# Patient Record
Sex: Male | Born: 1950 | Hispanic: Yes | Marital: Single | Smoking: Never smoker
Health system: Southern US, Community
[De-identification: ages and names within clinical notes are randomized; demographics above are authoritative.]

## PROBLEM LIST (undated history)

## (undated) DIAGNOSIS — E119 Type 2 diabetes mellitus without complications: Secondary | ICD-10-CM

## (undated) DIAGNOSIS — G4733 Obstructive sleep apnea (adult) (pediatric): Secondary | ICD-10-CM

## (undated) DIAGNOSIS — I10 Essential (primary) hypertension: Secondary | ICD-10-CM

## (undated) DIAGNOSIS — M5432 Sciatica, left side: Secondary | ICD-10-CM

## (undated) DIAGNOSIS — E785 Hyperlipidemia, unspecified: Secondary | ICD-10-CM

## (undated) DIAGNOSIS — E559 Vitamin D deficiency, unspecified: Secondary | ICD-10-CM

## (undated) HISTORY — DX: Type 2 diabetes mellitus without complications: E11.9

## (undated) HISTORY — DX: Obstructive sleep apnea (adult) (pediatric): G47.33

## (undated) HISTORY — DX: Vitamin D deficiency, unspecified: E55.9

## (undated) HISTORY — DX: Hyperlipidemia, unspecified: E78.5

## (undated) HISTORY — DX: Essential (primary) hypertension: I10

## (undated) HISTORY — DX: Pre-excitation syndrome: I45.6

## (undated) HISTORY — DX: Sciatica, left side: M54.32

## (undated) HISTORY — PX: APPENDECTOMY: SHX54

---

## 2020-09-11 DIAGNOSIS — E559 Vitamin D deficiency, unspecified: Secondary | ICD-10-CM | POA: Diagnosis not present

## 2020-09-11 DIAGNOSIS — E1169 Type 2 diabetes mellitus with other specified complication: Secondary | ICD-10-CM | POA: Diagnosis not present

## 2020-09-11 DIAGNOSIS — M5432 Sciatica, left side: Secondary | ICD-10-CM | POA: Diagnosis not present

## 2020-09-11 DIAGNOSIS — I10 Essential (primary) hypertension: Secondary | ICD-10-CM | POA: Diagnosis not present

## 2020-09-11 DIAGNOSIS — E785 Hyperlipidemia, unspecified: Secondary | ICD-10-CM | POA: Diagnosis not present

## 2020-09-11 DIAGNOSIS — I456 Pre-excitation syndrome: Secondary | ICD-10-CM | POA: Diagnosis not present

## 2020-11-11 DIAGNOSIS — E785 Hyperlipidemia, unspecified: Secondary | ICD-10-CM | POA: Diagnosis not present

## 2020-11-11 DIAGNOSIS — E559 Vitamin D deficiency, unspecified: Secondary | ICD-10-CM | POA: Diagnosis not present

## 2020-11-11 DIAGNOSIS — Z125 Encounter for screening for malignant neoplasm of prostate: Secondary | ICD-10-CM | POA: Diagnosis not present

## 2020-11-11 DIAGNOSIS — E1169 Type 2 diabetes mellitus with other specified complication: Secondary | ICD-10-CM | POA: Diagnosis not present

## 2020-11-11 DIAGNOSIS — I10 Essential (primary) hypertension: Secondary | ICD-10-CM | POA: Diagnosis not present

## 2020-11-11 DIAGNOSIS — Z7984 Long term (current) use of oral hypoglycemic drugs: Secondary | ICD-10-CM | POA: Diagnosis not present

## 2020-11-16 DIAGNOSIS — Z1389 Encounter for screening for other disorder: Secondary | ICD-10-CM | POA: Diagnosis not present

## 2020-11-16 DIAGNOSIS — Z0000 Encounter for general adult medical examination without abnormal findings: Secondary | ICD-10-CM | POA: Diagnosis not present

## 2020-11-16 DIAGNOSIS — I456 Pre-excitation syndrome: Secondary | ICD-10-CM | POA: Diagnosis not present

## 2020-11-16 DIAGNOSIS — E1169 Type 2 diabetes mellitus with other specified complication: Secondary | ICD-10-CM | POA: Diagnosis not present

## 2020-11-16 DIAGNOSIS — Z125 Encounter for screening for malignant neoplasm of prostate: Secondary | ICD-10-CM | POA: Diagnosis not present

## 2020-11-16 DIAGNOSIS — Z7984 Long term (current) use of oral hypoglycemic drugs: Secondary | ICD-10-CM | POA: Diagnosis not present

## 2020-11-16 DIAGNOSIS — E785 Hyperlipidemia, unspecified: Secondary | ICD-10-CM | POA: Diagnosis not present

## 2020-11-16 DIAGNOSIS — I10 Essential (primary) hypertension: Secondary | ICD-10-CM | POA: Diagnosis not present

## 2020-11-28 ENCOUNTER — Ambulatory Visit (HOSPITAL_COMMUNITY)
Admission: EM | Admit: 2020-11-28 | Discharge: 2020-11-28 | Disposition: A | Discharge status: Home / Self Care | Payer: Medicare HMO | Attending: Student | Admitting: Student

## 2020-11-28 ENCOUNTER — Encounter (HOSPITAL_COMMUNITY): Payer: Self-pay | Admitting: *Deleted

## 2020-11-28 ENCOUNTER — Other Ambulatory Visit: Payer: Self-pay

## 2020-11-28 DIAGNOSIS — U071 COVID-19: Secondary | ICD-10-CM | POA: Diagnosis not present

## 2020-11-28 DIAGNOSIS — J029 Acute pharyngitis, unspecified: Secondary | ICD-10-CM | POA: Diagnosis not present

## 2020-11-28 LAB — RESP PANEL BY RT-PCR (FLU A&B, COVID) ARPGX2
Influenza A by PCR: NEGATIVE
Influenza B by PCR: NEGATIVE
SARS Coronavirus 2 by RT PCR: POSITIVE — AB

## 2020-11-28 MED ORDER — LIDOCAINE VISCOUS HCL 2 % MT SOLN: 15.0000 mL | OROMUCOSAL | 0 refills | Status: DC | PRN

## 2020-11-28 NOTE — ED Triage Notes (Signed)
Pt reports cough and sore throat for 4 days.

## 2020-11-28 NOTE — ED Provider Notes (Signed)
[facility]    CSN: [id number] Arrival date & time: 11/28/20  1013      History   Chief Complaint Chief Complaint  Patient presents with  . Sore Throat    HPI Glenn Pope is a 70 y.o. male Presenting for URI symptoms for 2 days. Describes productive cough and sore throat x4 days. Describes sore throat as 6/10 pain, though denies pain currently. Endorsed chills 1 day ago but no longer. Denies fevers/chills, n/v/d, shortness of breath, chest pain,, congestion, facial pain, teeth pain, headaches,loss of taste/smell, swollen lymph nodes, ear pain. Denies chest pain, shortness of breath, confusion, high fevers. Fully vaccinated for covid-19.   HPI  History reviewed. No pertinent past medical history.  There are no problems to display for this patient.   History reviewed. No pertinent surgical history.     Home Medications    Prior to Admission medications   Medication Sig Start Date End Date Taking? Authorizing Provider  lidocaine (XYLOCAINE) 2 % solution Use as directed 15 mLs in the mouth or throat as needed for mouth pain. 11/28/20  Yes Rhys Martini, PA-C    Family History History reviewed. No pertinent family history.  Social History Social History   Tobacco Use  . Smoking status: Never Smoker  . Smokeless tobacco: Current User     Allergies   Patient has no known allergies.   Review of Systems Review of Systems  Constitutional: Negative for appetite change, chills and fever.  HENT: Positive for sore throat. Negative for congestion, ear pain, rhinorrhea, sinus pressure and sinus pain.   Eyes: Negative for redness and visual disturbance.  Respiratory: Positive for cough. Negative for chest tightness, shortness of breath and wheezing.   Cardiovascular: Negative for chest pain and palpitations.  Gastrointestinal: Negative for abdominal pain, constipation, diarrhea, nausea and vomiting.  Genitourinary: Negative for dysuria, frequency and urgency.   Musculoskeletal: Negative for myalgias.  Neurological: Negative for dizziness, weakness and headaches.  Psychiatric/Behavioral: Negative for confusion.  All other systems reviewed and are negative.    Physical Exam Triage Vital Signs ED Triage Vitals  Enc Vitals Group     BP 11/28/20 1057 113/73     Pulse Rate 11/28/20 1057 83     Resp 11/28/20 1057 16     Temp 11/28/20 1057 98.6 F (37 C)     Temp Source 11/28/20 1057 Oral     SpO2 11/28/20 1057 96 %     Weight --      Height --      Head Circumference --      Peak Flow --      Pain Score 11/28/20 1059 0     Pain Loc --      Pain Edu? --      Excl. in GC? --    No data found.  Updated Vital Signs BP 113/73 (BP Location: Right Arm)   Pulse 83   Temp 98.6 F (37 C) (Oral)   Resp 16   SpO2 96%   Visual Acuity Right Eye Distance:   Left Eye Distance:   Bilateral Distance:    Right Eye Near:   Left Eye Near:    Bilateral Near:     Physical Exam Vitals reviewed.  Constitutional:      General: He is not in acute distress.    Appearance: Normal appearance. He is not ill-appearing.  HENT:     Head: Normocephalic and atraumatic.     Right Ear: Hearing, tympanic  membrane, ear canal and external ear normal. No swelling or tenderness. There is no impacted cerumen. No mastoid tenderness. Tympanic membrane is not perforated, erythematous, retracted or bulging.     Left Ear: Hearing, tympanic membrane, ear canal and external ear normal. No swelling or tenderness. There is no impacted cerumen. No mastoid tenderness. Tympanic membrane is not perforated, erythematous, retracted or bulging.     Nose:     Right Sinus: No maxillary sinus tenderness or frontal sinus tenderness.     Left Sinus: No maxillary sinus tenderness or frontal sinus tenderness.     Mouth/Throat:     Mouth: Mucous membranes are moist.     Pharynx: Uvula midline. Posterior oropharyngeal erythema present. No oropharyngeal exudate.     Tonsils: No  tonsillar exudate.  Cardiovascular:     Rate and Rhythm: Normal rate and regular rhythm.     Heart sounds: Normal heart sounds.  Pulmonary:     Breath sounds: Normal breath sounds and air entry. No wheezing, rhonchi or rales.  Chest:     Chest wall: No tenderness.  Abdominal:     General: Abdomen is flat. Bowel sounds are normal.     Tenderness: There is no abdominal tenderness. There is no guarding or rebound.  Lymphadenopathy:     Cervical: No cervical adenopathy.  Neurological:     General: No focal deficit present.     Mental Status: He is alert and oriented to person, place, and time.  Psychiatric:        Attention and Perception: Attention and perception normal.        Mood and Affect: Mood and affect normal.        Behavior: Behavior normal. Behavior is cooperative.        Thought Content: Thought content normal.        Judgment: Judgment normal.      UC Treatments / Results  Labs (all labs ordered are listed, but only abnormal results are displayed) Labs Reviewed  RESP PANEL BY RT-PCR (FLU A&B, COVID) [id number]    EKG   Radiology No results found.  Procedures Procedures (including critical care time)  Medications Ordered in UC Medications - No data to display  Initial Impression / Assessment and Plan / UC Course  I have reviewed the triage vital signs and the nursing notes.  Pertinent labs & imaging results that were available during my care of the patient were reviewed by me and considered in my medical decision making (see chart for details).     Covid and influenza tests sent today. Patient is fully vaccinated for covid-19. Isolation precautions per CDC guidelines until negative result. Symptomatic relief with OTC Mucinex, Nyquil, etc. Lidocaine mouthwash for sore throat. Return precautions- new/worsening fevers/chills, shortness of breath, chest pain, abd pain, etc.   Final Clinical Impressions(s) / UC Diagnoses   Final diagnoses:  Viral pharyngitis      Discharge Instructions     Gargle lidocaine mouthwash every 4-6 hours as needed. Make sure not to swallow this. Don't eat for 1 hour after using.   .seek additional medical attention if you experience shortness of breath, chest pain, high fevers, etc.     ED Prescriptions    Medication Sig Dispense Auth. Provider   lidocaine (XYLOCAINE) 2 % solution Use as directed 15 mLs in the mouth or throat as needed for mouth pain. 100 mL Rhys Martini, PA-C     PDMP not reviewed this encounter.   Rhys Martini, PA-C  11/28/20 1153  

## 2020-11-28 NOTE — ED Triage Notes (Signed)
Pt reports he has had strep throat in thepast.

## 2020-11-28 NOTE — Discharge Instructions (Addendum)
Gargle lidocaine mouthwash every 4-6 hours as needed. Make sure not to swallow this. Don't eat for 1 hour after using.   .seek additional medical attention if you experience shortness of breath, chest pain, high fevers, etc.

## 2020-12-11 ENCOUNTER — Ambulatory Visit: Payer: Medicare HMO | Admitting: Cardiology

## 2020-12-11 ENCOUNTER — Other Ambulatory Visit: Payer: Self-pay

## 2020-12-11 ENCOUNTER — Encounter: Payer: Self-pay | Admitting: Cardiology

## 2020-12-11 VITALS — BP 90/60 | HR 58 | Ht 67.0 in | Wt 181.4 lb

## 2020-12-11 DIAGNOSIS — E7800 Pure hypercholesterolemia, unspecified: Secondary | ICD-10-CM | POA: Diagnosis not present

## 2020-12-11 DIAGNOSIS — I10 Essential (primary) hypertension: Secondary | ICD-10-CM

## 2020-12-11 DIAGNOSIS — I456 Pre-excitation syndrome: Secondary | ICD-10-CM | POA: Diagnosis not present

## 2020-12-11 MED ORDER — FLECAINIDE ACETATE 100 MG PO TABS: 100.0000 mg | ORAL_TABLET | Freq: Two times a day (BID) | ORAL | 3 refills | Status: DC

## 2020-12-11 NOTE — Patient Instructions (Addendum)
Medication Instructions:  No changes *If you need a refill on your cardiac medications before your next appointment, please call your pharmacy*   Lab Work: none If you have labs (blood work) drawn today and your tests are completely normal, you will receive your results only by: Marland Kitchen MyChart Message (if you have MyChart) OR . A paper copy in the mail If you have any lab test that is abnormal or we need to change your treatment, we will call you to review the results.   Testing/Procedures: none   Follow-Up: You have been referred to Cardiac Electrophysiology.  We will schedule you for a follow up visit with the Electrophysiologist for July 2022.    Other Instructions

## 2020-12-11 NOTE — Progress Notes (Signed)
Cardiology Office Note:    Date:  12/11/2020   ID:  Glenn Pope, DOB 05/23/1951, MRN [id number]  PCP:  Patient, No Pcp Per  [facility] Cardiologist:  No primary care provider on file.  [facility] Electrophysiologist:  None   Referring MD: Deatra James, MD    History of Present Illness:    Glenn Pope is a 70 y.o. male with a hx of HTN, HLD, DMII, OSA and WPW and recent COVID-19 infection on 11/29/20 who was referred by Dr. Wynelle Link for management of WPW.  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 ECG was obtained and he was told that he had WPW syndrome. He had recurrence of symptoms that usually resolved with vagal maneuvers. He subsequently moved to [facility] where he began to follow with a Cardiologist. 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  He had one episode of palpitations where vagal maneuvers failed to break the rhythm a couple of years ago requiring ICU stay DCCV. He therefore was started on flecainide and he has not had recurrence of symptoms. Last year he had a stress test which was reportedly normal.   Currently, the patient denies any chest pain, SOB, orthopnea, or LE edema. Has had one episode of orthostasis with LOC when bending over to wash his car but did not correlate with palpitations or arrhythmias. 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   Past Medical History:  Diagnosis Date  . DM (diabetes mellitus) (HCC)   . Hyperlipidemia   . Hypertension   . Left sciatic nerve pain   . OSA (obstructive sleep apnea)   . Vitamin D deficiency   . WPW (Wolff-Parkinson-White syndrome)     No past surgical history on file.  Current Medications: Current Meds  Medication Sig  . atorvastatin (LIPITOR) 40 MG tablet Take 40 mg by mouth daily.  .  cholecalciferol (VITAMIN D3) 25 MCG (1000 UNIT) tablet Take 1,000 Units by mouth daily.  Marland Kitchen lisinopril (ZESTRIL) 20 MG tablet Take 20 mg by mouth daily.  . metFORMIN (GLUCOPHAGE-XR) 500 MG 24 hr tablet Take 500 mg by mouth daily with breakfast.  . metoprolol tartrate (LOPRESSOR) 50 MG tablet Take 50 mg by mouth 2 (two) times daily.  . [DISCONTINUED] flecainide (TAMBOCOR) 100 MG tablet Take 100 mg by mouth 2 (two) times daily.     Allergies:   Patient has no known allergies.   Social History   Socioeconomic History  . Marital status: Single    Spouse name: Not on file  . Number of children: Not on file  . Years of education: Not on file  . Highest education level: Not on file  Occupational History  . Not on file  Tobacco Use  . Smoking status: Never Smoker  . Smokeless tobacco: Current User  Substance and Sexual Activity  . Alcohol use: Not on file  . Drug use: Not on file  . Sexual activity: Not on file  Other Topics Concern  . Not on file  Social History Narrative  . Not on file   Social Determinants of Health   Financial Resource Strain: Not on file  Food Insecurity: Not on file  Transportation Needs: Not on file  Physical Activity: Not on file  Stress: Not on  file  Social Connections: Not on file     Family History: The patient's family history includes Diabetes in his father; Hyperlipidemia in his mother.  ROS:   Please see the history of present illness.    Review of Systems  Constitutional: Negative for chills and fever.  HENT: Negative for nosebleeds.   Eyes: Negative for blurred vision.  Respiratory: Negative for sputum production.   Cardiovascular: Negative for chest pain, palpitations, orthopnea, claudication, leg swelling and PND.  Gastrointestinal: Negative for nausea and vomiting.  Genitourinary: Negative for hematuria.  Musculoskeletal: Negative for falls.  Neurological: Negative for headaches.  Endo/Heme/Allergies: Negative for polydipsia.   Psychiatric/Behavioral: Negative for substance abuse.    EKGs/Labs/Other Studies Reviewed:    The following studies were reviewed today:  No cardiac imaging. Will obtain records.  EKG:  EKG is  ordered today.  The ekg ordered today demonstrates sinus bradycardia, q-waves in V1-V3. Normal PR, normal QRS intervals. QTc 396. No distinct delta wave visualized.  Recent Labs: No results found for requested labs within last 8760 hours.  Recent Lipid Panel No results found for: CHOL, TRIG, HDL, CHOLHDL, VLDL, LDLCALC, LDLDIRECT    Physical Exam:    VS:  BP 90/60   Pulse (!) 58   Ht 5\' 7"  (1.702 m)   Wt 181 lb 6.4 oz (82.3 kg)   SpO2 94%   BMI 28.41 kg/m     Wt Readings from Last 3 Encounters:  12/11/20 181 lb 6.4 oz (82.3 kg)     GEN:  Well nourished, well developed in no acute distress HEENT: Normal NECK: No JVD; No carotid bruits CARDIAC: RRR, no murmurs, rubs, gallops RESPIRATORY:  Clear to auscultation without rales, wheezing or rhonchi  ABDOMEN: Soft, non-tender, non-distended MUSCULOSKELETAL:  No edema; No deformity  SKIN: Warm and dry NEUROLOGIC:  Alert and oriented x 3 PSYCHIATRIC:  Normal affect   ASSESSMENT:    1. WPW (Wolff-Parkinson-White syndrome)   2. Primary hypertension   3. Pure hypercholesterolemia    PLAN:    In order of problems listed above:  #WPW: Patient diagnosed with WPW several years ago while working in the 12/13/20. [place] to [place] where he was managed by Cardiology. 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 Has been maintained on flecainide with no recurrence of palpitations. -Continue flecainide 100mg  BID and metop (has been tolerating BB) -Obtain records from [facility] Cardiologist regarding recent TTE, attempted ablation and stress testing -Will have him follow with EP going forward; wants to avoid ablation for now as he has been well controlled on flec and  metop  #HTN: -Continue lisinopril 20mg  daily  #HLD: Managed by PCP. Well controlled with last LDL 60. -Continue lipitor 40mg  daily   Medication Adjustments/Labs and Tests Ordered: Current medicines are reviewed at length with the patient today.  Concerns regarding medicines are outlined above.  Orders Placed This Encounter  Procedures  . Ambulatory referral to Cardiac Electrophysiology  . EKG 12-Lead   Meds ordered this encounter  Medications  . flecainide (TAMBOCOR) 100 MG tablet    Sig: Take 1 tablet (100 mg total) by mouth 2 (two) times daily.    Dispense:  180 tablet    Refill:  3    Patient Instructions  Medication Instructions:  No changes *If you need a refill on your cardiac medications before your next appointment, please call your pharmacy*   Lab Work: none If you have  labs (blood work) drawn today and your tests are completely normal, you will receive your results only by: Marland Kitchen MyChart Message (if you have MyChart) OR . A paper copy in the mail If you have any lab test that is abnormal or we need to change your treatment, we will call you to review the results.   Testing/Procedures: none   Follow-Up: You have been referred to Cardiac Electrophysiology.  We will schedule you for a follow up visit with the Electrophysiologist for July 2022.    Other Instructions      Signed, Meriam Sprague, MD  12/11/2020 10:01 AM    [facility]

## 2021-01-04 DIAGNOSIS — Z20822 Contact with and (suspected) exposure to covid-19: Secondary | ICD-10-CM | POA: Diagnosis not present

## 2021-02-05 DIAGNOSIS — F331 Major depressive disorder, recurrent, moderate: Secondary | ICD-10-CM | POA: Diagnosis not present

## 2021-02-11 DIAGNOSIS — F4320 Adjustment disorder, unspecified: Secondary | ICD-10-CM | POA: Diagnosis not present

## 2021-02-24 DIAGNOSIS — F4320 Adjustment disorder, unspecified: Secondary | ICD-10-CM | POA: Diagnosis not present

## 2021-03-03 DIAGNOSIS — K0510 Chronic gingivitis, plaque induced: Secondary | ICD-10-CM | POA: Diagnosis not present

## 2021-03-04 DIAGNOSIS — F4320 Adjustment disorder, unspecified: Secondary | ICD-10-CM | POA: Diagnosis not present

## 2021-03-08 ENCOUNTER — Emergency Department (HOSPITAL_COMMUNITY)
Admission: EM | Admit: 2021-03-08 | Discharge: 2021-03-08 | Disposition: A | Discharge status: Home / Self Care | Payer: Medicare HMO | Attending: Emergency Medicine | Admitting: Emergency Medicine

## 2021-03-08 ENCOUNTER — Encounter (HOSPITAL_COMMUNITY): Payer: Self-pay

## 2021-03-08 ENCOUNTER — Emergency Department (HOSPITAL_COMMUNITY): Payer: Medicare HMO

## 2021-03-08 ENCOUNTER — Other Ambulatory Visit: Payer: Self-pay

## 2021-03-08 DIAGNOSIS — Z79899 Other long term (current) drug therapy: Secondary | ICD-10-CM | POA: Diagnosis not present

## 2021-03-08 DIAGNOSIS — R079 Chest pain, unspecified: Secondary | ICD-10-CM | POA: Diagnosis not present

## 2021-03-08 DIAGNOSIS — R072 Precordial pain: Secondary | ICD-10-CM | POA: Diagnosis not present

## 2021-03-08 DIAGNOSIS — R0789 Other chest pain: Secondary | ICD-10-CM | POA: Diagnosis not present

## 2021-03-08 DIAGNOSIS — I10 Essential (primary) hypertension: Secondary | ICD-10-CM | POA: Insufficient documentation

## 2021-03-08 DIAGNOSIS — E119 Type 2 diabetes mellitus without complications: Secondary | ICD-10-CM | POA: Diagnosis not present

## 2021-03-08 DIAGNOSIS — Z7984 Long term (current) use of oral hypoglycemic drugs: Secondary | ICD-10-CM | POA: Insufficient documentation

## 2021-03-08 LAB — COMPREHENSIVE METABOLIC PANEL
ALT: 31 U/L (ref 0–44)
AST: 22 U/L (ref 15–41)
Albumin: 3.9 g/dL (ref 3.5–5.0)
Alkaline Phosphatase: 74 U/L (ref 38–126)
Anion gap: 8 (ref 5–15)
BUN: 17 mg/dL (ref 8–23)
CO2: 27 mmol/L (ref 22–32)
Calcium: 9.7 mg/dL (ref 8.9–10.3)
Chloride: 102 mmol/L (ref 98–111)
Creatinine, Ser: 1.03 mg/dL (ref 0.61–1.24)
GFR, Estimated: >60 mL/min (ref >60)
Glucose, Bld: 115 mg/dL — ABNORMAL HIGH (ref 70–99)
Potassium: 4.5 mmol/L (ref 3.5–5.1)
Sodium: 137 mmol/L (ref 135–145)
Total Bilirubin: 1 mg/dL (ref 0.3–1.2)
Total Protein: 7 g/dL (ref 6.5–8.1)

## 2021-03-08 LAB — CBC
HCT: 49.4 % (ref 39.0–52.0)
Hemoglobin: 16.2 g/dL (ref 13.0–17.0)
MCH: 28.8 pg (ref 26.0–34.0)
MCHC: 32.8 g/dL (ref 30.0–36.0)
MCV: 87.7 fL (ref 80.0–100.0)
Platelets: 267 10*3/uL (ref 150–400)
RBC: 5.63 MIL/uL (ref 4.22–5.81)
RDW: 14.5 % (ref 11.5–15.5)
WBC: 6.6 10*3/uL (ref 4.0–10.5)
nRBC: 0 % (ref 0.0–0.2)

## 2021-03-08 LAB — TROPONIN I (HIGH SENSITIVITY)
Troponin I (High Sensitivity): 4 ng/L (ref <18)
Troponin I (High Sensitivity): 3 ng/L (ref <18)

## 2021-03-08 LAB — LIPASE, BLOOD: Lipase: 51 U/L (ref 11–51)

## 2021-03-08 NOTE — ED Notes (Signed)
Pt discharged and ambulated out of the ED without difficulty. 

## 2021-03-08 NOTE — ED Provider Notes (Signed)
I provided a substantive portion of the care of this patient.  I personally performed the entirety of the medical decision making for this encounter.  EKG Interpretation  Date/Time:  Monday March 08 2021 17:26:17 EDT Ventricular Rate:  77 PR Interval:  196 QRS Duration: 74 QT Interval:  370 QTC Calculation: 418 R Axis:   86 Text Interpretation: Normal sinus rhythm Anterior infarct , age undetermined Abnormal ECG Confirmed by Lorre Nick ([id number]) on 03/08/2021 10:15:32 PM  70 year old male presents after experiencing epigastric pain that got worse after he had fried foods.  Troponins are negative here.  No concern for ACS.  Likely GERD and will discharge   Lorre Nick, MD 03/08/21 2231

## 2021-03-08 NOTE — ED Provider Notes (Signed)
[facility] EMERGENCY DEPARTMENT Provider Note   CSN: [id number] Arrival date & time: 03/08/21  1720     History Chief Complaint  Patient presents with  . Chest Pain    Glenn Pope is a 70 y.o. male past medical history of diabetes, hyperlipidemia, hypertension, OSA, WPW, presenting for evaluation of chest pain. Pain began a few days ago, worsening today. CP is described as central, tightness/pressure. Concerned for symptomatic WPW. Never got ablated, taking flecainide. Pressure comes and goes, belching improves. No aggravating factors. No medications tried for symptoms. Pain does not radiate, it is not exertional or pleuritic.  Endorses increased stress level to town.  He has been seeing a therapist for the last couple of weeks, though does not think it is providing improvement.  He is not taking any medication for this, nor does he want to.    3-4 loose stools today. No abd pain, N/V, SOB, LH. No tobacco use.  The history is provided by the patient.       Past Medical History:  Diagnosis Date  . DM (diabetes mellitus) (HCC)   . Hyperlipidemia   . Hypertension   . Left sciatic nerve pain   . OSA (obstructive sleep apnea)   . Vitamin D deficiency   . WPW (Wolff-Parkinson-White syndrome)     There are no problems to display for this patient.   History reviewed. No pertinent surgical history.     Family History  Problem Relation Age of Onset  . Hyperlipidemia Mother   . Diabetes Father     Social History   Tobacco Use  . Smoking status: Never Smoker  . Smokeless tobacco: Never Used  Vaping Use  . Vaping Use: Never used  Substance Use Topics  . Alcohol use: Yes    Comment: occasionally  . Drug use: Never    Home Medications Prior to Admission medications   Medication Sig Start Date End Date Taking? Authorizing Provider  atorvastatin (LIPITOR) 40 MG tablet Take 40 mg by mouth daily.    [provider]  cholecalciferol  (VITAMIN D3) 25 MCG (1000 UNIT) tablet Take 1,000 Units by mouth daily.    [provider]  flecainide (TAMBOCOR) 100 MG tablet Take 1 tablet (100 mg total) by mouth 2 (two) times daily. 12/11/20   Meriam Sprague, MD  lisinopril (ZESTRIL) 20 MG tablet Take 20 mg by mouth daily.    [provider]  metFORMIN (GLUCOPHAGE-XR) 500 MG 24 hr tablet Take 500 mg by mouth daily with breakfast.    [provider]  metoprolol tartrate (LOPRESSOR) 50 MG tablet Take 50 mg by mouth 2 (two) times daily.    [provider]    Allergies    Patient has no known allergies.  Review of Systems   Review of Systems  All other systems reviewed and are negative.   Physical Exam Updated Vital Signs BP 135/74   Pulse 65   Temp 98.5 F (36.9 C) (Oral)   Resp (!) 21   Ht 5\' 7"  (1.702 m)   Wt 83.9 kg   SpO2 97%   BMI 28.98 kg/m   Physical Exam Vitals and nursing note reviewed.  Constitutional:      General: He is not in acute distress.    Appearance: He is well-developed.  HENT:     Head: Normocephalic and atraumatic.  Eyes:     Conjunctiva/sclera: Conjunctivae normal.  Cardiovascular:     Rate and Rhythm: Normal rate  and regular rhythm.     Pulses: Normal pulses.  Pulmonary:     Effort: Pulmonary effort is normal. No respiratory distress.     Breath sounds: Normal breath sounds.  Chest:     Chest wall: Tenderness (lower sternal) present.  Abdominal:     General: Bowel sounds are normal.     Palpations: Abdomen is soft.     Tenderness: There is no abdominal tenderness.  Musculoskeletal:     Right lower leg: No edema.     Left lower leg: No edema.  Skin:    General: Skin is warm.  Neurological:     Mental Status: He is alert.  Psychiatric:        Behavior: Behavior normal.     ED Results / Procedures / Treatments   Labs (all labs ordered are listed, but only abnormal results are displayed) Labs Reviewed  COMPREHENSIVE METABOLIC PANEL -  Abnormal; Notable for the following components:      Result Value   Glucose, Bld 115 (*)    All other components within normal limits  CBC  LIPASE, BLOOD  TROPONIN I (HIGH SENSITIVITY)  TROPONIN I (HIGH SENSITIVITY)    EKG EKG Interpretation  Date/Time:  Monday March 08 2021 17:26:17 EDT Ventricular Rate:  77 PR Interval:  196 QRS Duration: 74 QT Interval:  370 QTC Calculation: 418 R Axis:   86 Text Interpretation: Normal sinus rhythm Anterior infarct , age undetermined Abnormal ECG Confirmed by Lorre Nick ([id number]) on 03/08/2021 10:18:54 PM   Radiology DG Chest 2 View  Result Date: 03/08/2021 CLINICAL DATA:  Central chest pain for 4 days EXAM: CHEST - 2 VIEW COMPARISON:  None. FINDINGS: The heart size and mediastinal contours are within normal limits. Both lungs are clear. Disc degenerative disease of the thoracic spine. IMPRESSION: No acute abnormality of the lungs. Electronically Signed   By: Lauralyn Primes M.D.   On: 03/08/2021 18:20    Procedures Procedures   Medications Ordered in ED Medications - No data to display  ED Course  I have reviewed the triage vital signs and the nursing notes.  Pertinent labs & imaging results that were available during my care of the patient were reviewed by me and considered in my medical decision making (see chart for details).    MDM Rules/Calculators/A&P                           Pt presenting with substernal chest pain relieved with belching, and increased stress level. Low risk wells, VSS, no tracheal deviation, no JVD or new murmur, RRR, breath sounds equal bilaterally, EKG without acute abnormalities, negative troponin x2, and negative CXR. HEAR score 4. Patient is to be discharged with recommendation to follow up with his cardiologist in regards to today's hospital visit. Pt has been advised to return to the ED if CP becomes exertional, associated with diaphoresis or nausea, radiates to left jaw/arm, worsens or becomes concerning  in any way. Pt appears reliable for follow up and is agreeable to discharge.    Patient was discussed with and evaluated by Dr. Freida Busman.  Discussed results, findings, treatment and follow up. Patient advised of return precautions. Patient verbalized understanding and agreed with plan.   Final Clinical Impression(s) / ED Diagnoses Final diagnoses:  Central chest pain    Rx / DC Orders ED Discharge Orders    None       Maiko Salais, [place] N, PA-C 03/09/21 0005  Lorre Nick, MD 03/11/21 [phone]

## 2021-03-08 NOTE — ED Notes (Signed)
Pt c/o chest pressure that started earlier today. Pt reports pressure is gone at this time. Pt has a history of WPW.

## 2021-03-08 NOTE — ED Triage Notes (Signed)
Pt has been having intermittent chest pressure for past few days. Pt states he feels "gassy" with the pain. Pt has had shortness of breath, denies sweating, nausea or vomiting.

## 2021-03-08 NOTE — ED Triage Notes (Signed)
Emergency Medicine Provider Triage Evaluation Note  Glenn Pope , a 70 y.o. male  was evaluated in triage.  Pt complains of chest discomfort.  Located central chest.  Like he burps when he gets this pain.  Does not radiate to left arm, left back or jaw.  He denies any abdominal pain.  Does state he has had multiple loose stools today.  He has had 4 bowel movements today without any melena or bright blood per rectum.  No prior history of pancreatitis.  Chest pain is not exertional or pleuritic in nature.  No lateral leg swelling, redness or warmth.  Review of Systems  Positive: Chest discomfort Negative: Diaphoresis, nausea, vomiting, abdominal pain, melena, bright blood per rectum  Physical Exam  BP 135/87   Pulse 77   Temp 99.2 F (37.3 C)   Resp 17   Ht 5\' 7"  (1.702 m)   Wt 83.9 kg   SpO2 93%   BMI 28.98 kg/m  Gen:   Awake, no distress   HEENT:  Atraumatic  Resp:  Normal effort  Cardiac:  Normal rate  Abd:   Nondistended, nontender  MSK:   Moves extremities without difficulty  Neuro:  Speech clear   Medical Decision Making  Medically screening exam initiated at 5:35 PM.  Appropriate orders placed.  Haruki Arnold was informed that the remainder of the evaluation will be completed by another provider, this initial triage assessment does not replace that evaluation, and the importance of remaining in the ED until their evaluation is complete.  Clinical Impression  Chest discomfort   Petr Bontempo A, PA-C 03/08/21 1737

## 2021-03-08 NOTE — Discharge Instructions (Addendum)
Please read instructions below. Follow up with your primary care provider/cardiology this week. You can try treating your symptoms with antacids. Return to the ER for new or worsening symptoms; including worsening chest pain, shortness of breath, pain that radiates to the arm or neck, pain or shortness of breath worsened with exertion.

## 2021-03-09 DIAGNOSIS — K219 Gastro-esophageal reflux disease without esophagitis: Secondary | ICD-10-CM | POA: Diagnosis not present

## 2021-03-09 DIAGNOSIS — R079 Chest pain, unspecified: Secondary | ICD-10-CM | POA: Diagnosis not present

## 2021-03-11 DIAGNOSIS — F4320 Adjustment disorder, unspecified: Secondary | ICD-10-CM | POA: Diagnosis not present

## 2021-03-16 DIAGNOSIS — F4320 Adjustment disorder, unspecified: Secondary | ICD-10-CM | POA: Diagnosis not present

## 2021-03-24 DIAGNOSIS — F4320 Adjustment disorder, unspecified: Secondary | ICD-10-CM | POA: Diagnosis not present

## 2021-04-09 DIAGNOSIS — K219 Gastro-esophageal reflux disease without esophagitis: Secondary | ICD-10-CM | POA: Diagnosis not present

## 2021-04-09 DIAGNOSIS — I456 Pre-excitation syndrome: Secondary | ICD-10-CM | POA: Diagnosis not present

## 2021-04-27 DIAGNOSIS — Z833 Family history of diabetes mellitus: Secondary | ICD-10-CM | POA: Diagnosis not present

## 2021-04-27 DIAGNOSIS — I10 Essential (primary) hypertension: Secondary | ICD-10-CM | POA: Diagnosis not present

## 2021-04-27 DIAGNOSIS — E119 Type 2 diabetes mellitus without complications: Secondary | ICD-10-CM | POA: Diagnosis not present

## 2021-04-27 DIAGNOSIS — E785 Hyperlipidemia, unspecified: Secondary | ICD-10-CM | POA: Diagnosis not present

## 2021-04-27 DIAGNOSIS — Z7984 Long term (current) use of oral hypoglycemic drugs: Secondary | ICD-10-CM | POA: Diagnosis not present

## 2021-04-27 DIAGNOSIS — I25119 Atherosclerotic heart disease of native coronary artery with unspecified angina pectoris: Secondary | ICD-10-CM | POA: Diagnosis not present

## 2021-04-27 DIAGNOSIS — E663 Overweight: Secondary | ICD-10-CM | POA: Diagnosis not present

## 2021-04-27 DIAGNOSIS — I456 Pre-excitation syndrome: Secondary | ICD-10-CM | POA: Diagnosis not present

## 2021-04-27 DIAGNOSIS — Z6829 Body mass index (BMI) 29.0-29.9, adult: Secondary | ICD-10-CM | POA: Diagnosis not present

## 2021-05-18 DIAGNOSIS — E1169 Type 2 diabetes mellitus with other specified complication: Secondary | ICD-10-CM | POA: Diagnosis not present

## 2021-05-18 DIAGNOSIS — I10 Essential (primary) hypertension: Secondary | ICD-10-CM | POA: Diagnosis not present

## 2021-05-18 DIAGNOSIS — Z7984 Long term (current) use of oral hypoglycemic drugs: Secondary | ICD-10-CM | POA: Diagnosis not present

## 2021-05-18 DIAGNOSIS — E785 Hyperlipidemia, unspecified: Secondary | ICD-10-CM | POA: Diagnosis not present

## 2021-05-20 ENCOUNTER — Other Ambulatory Visit: Payer: Self-pay

## 2021-05-20 ENCOUNTER — Encounter: Payer: Self-pay | Admitting: Internal Medicine

## 2021-05-20 ENCOUNTER — Ambulatory Visit: Payer: Medicare HMO | Admitting: Internal Medicine

## 2021-05-20 DIAGNOSIS — I456 Pre-excitation syndrome: Secondary | ICD-10-CM | POA: Diagnosis not present

## 2021-05-20 NOTE — Patient Instructions (Addendum)

## 2021-05-20 NOTE — Progress Notes (Signed)
HPI [name] is referred today by Dr. Shari Prows for evaluation of WPW. He has a h/o palpitations and ventricular pre-excitation. He underwent attempted ablation 6 years ago in [facility] but was told that his pathway was said to be too close to his AV node and he was not ablated. Since then he has been on flecainide and his symptoms are mostly controlled. He denies syncope.  No Known Allergies   Current Outpatient Medications  Medication Sig Dispense Refill   atorvastatin (LIPITOR) 40 MG tablet Take 40 mg by mouth daily.     cholecalciferol (VITAMIN D3) 25 MCG (1000 UNIT) tablet Take 1,000 Units by mouth daily.     flecainide (TAMBOCOR) 100 MG tablet Take 1 tablet (100 mg total) by mouth 2 (two) times daily. 180 tablet 3   lisinopril (ZESTRIL) 20 MG tablet Take 20 mg by mouth daily.     metFORMIN (GLUCOPHAGE-XR) 500 MG 24 hr tablet Take 500 mg by mouth daily with breakfast.     metoprolol tartrate (LOPRESSOR) 50 MG tablet Take 50 mg by mouth 2 (two) times daily.     No current facility-administered medications for this visit.     Past Medical History:  Diagnosis Date   DM (diabetes mellitus) (HCC)    Hyperlipidemia    Hypertension    Left sciatic nerve pain    OSA (obstructive sleep apnea)    Vitamin D deficiency    WPW (Wolff-Parkinson-White syndrome)     ROS:   All systems reviewed and negative except as noted in the HPI.   History reviewed. No pertinent surgical history.   Family History  Problem Relation Age of Onset   Hyperlipidemia Mother    Diabetes Father      Social History   Socioeconomic History   Marital status: Single    Spouse name: Not on file   Number of children: Not on file   Years of education: Not on file   Highest education level: Not on file  Occupational History   Not on file  Tobacco Use   Smoking status: Never   Smokeless tobacco: Never  Vaping Use   Vaping Use: Never used  Substance and Sexual Activity   Alcohol use:  Yes    Comment: occasionally   Drug use: Never   Sexual activity: Not on file  Other Topics Concern   Not on file  Social History Narrative   Not on file   Social Determinants of Health   Financial Resource Strain: Not on file  Food Insecurity: Not on file  Transportation Needs: Not on file  Physical Activity: Not on file  Stress: Not on file  Social Connections: Not on file  Intimate Partner Violence: Not on file     BP 112/68   Pulse (!) 59   Ht 5\' 7"  (1.702 m)   Wt 184 lb 9.6 oz (83.7 kg)   SpO2 97%   BMI 28.91 kg/m   Physical Exam:  Well appearing overweight 70 yo man, NAD HEENT: Unremarkable Neck:  6 cm JVD, no thyromegally Lymphatics:  No adenopathy Back:  No CVA tenderness Lungs:  Clear with no wheezes HEART:  Regular rate rhythm, no murmurs, no rubs, no clicks Abd:  soft, obese, positive bowel sounds, no organomegally, no rebound, no guarding Ext:  2 plus pulses, no edema, no cyanosis, no clubbing Skin:  No rashes no nodules Neuro:  CN II through XII intact, motor grossly intact  EKG - sinus brady  with no pre-excitation   Assess/Plan:  WPW - he has no pre-excitation on his ECG. He appears to be well controlled on flecainide. I discussed catheter ablation and PPM and recommend that he continue the flecainide. HTN - his bp is well controlled. No change in meds. Dyslipidemia -he will continue his statin therapy  Dorathy Daft

## 2021-06-22 DIAGNOSIS — H524 Presbyopia: Secondary | ICD-10-CM | POA: Diagnosis not present

## 2021-08-07 IMAGING — CR DG CHEST 2V
2 series · 2 of 2 positions shown · non-contrast
Comparison: None.

CLINICAL DATA: Central chest pain for 4 days

EXAM:
CHEST - 2 VIEW

[chest pa]
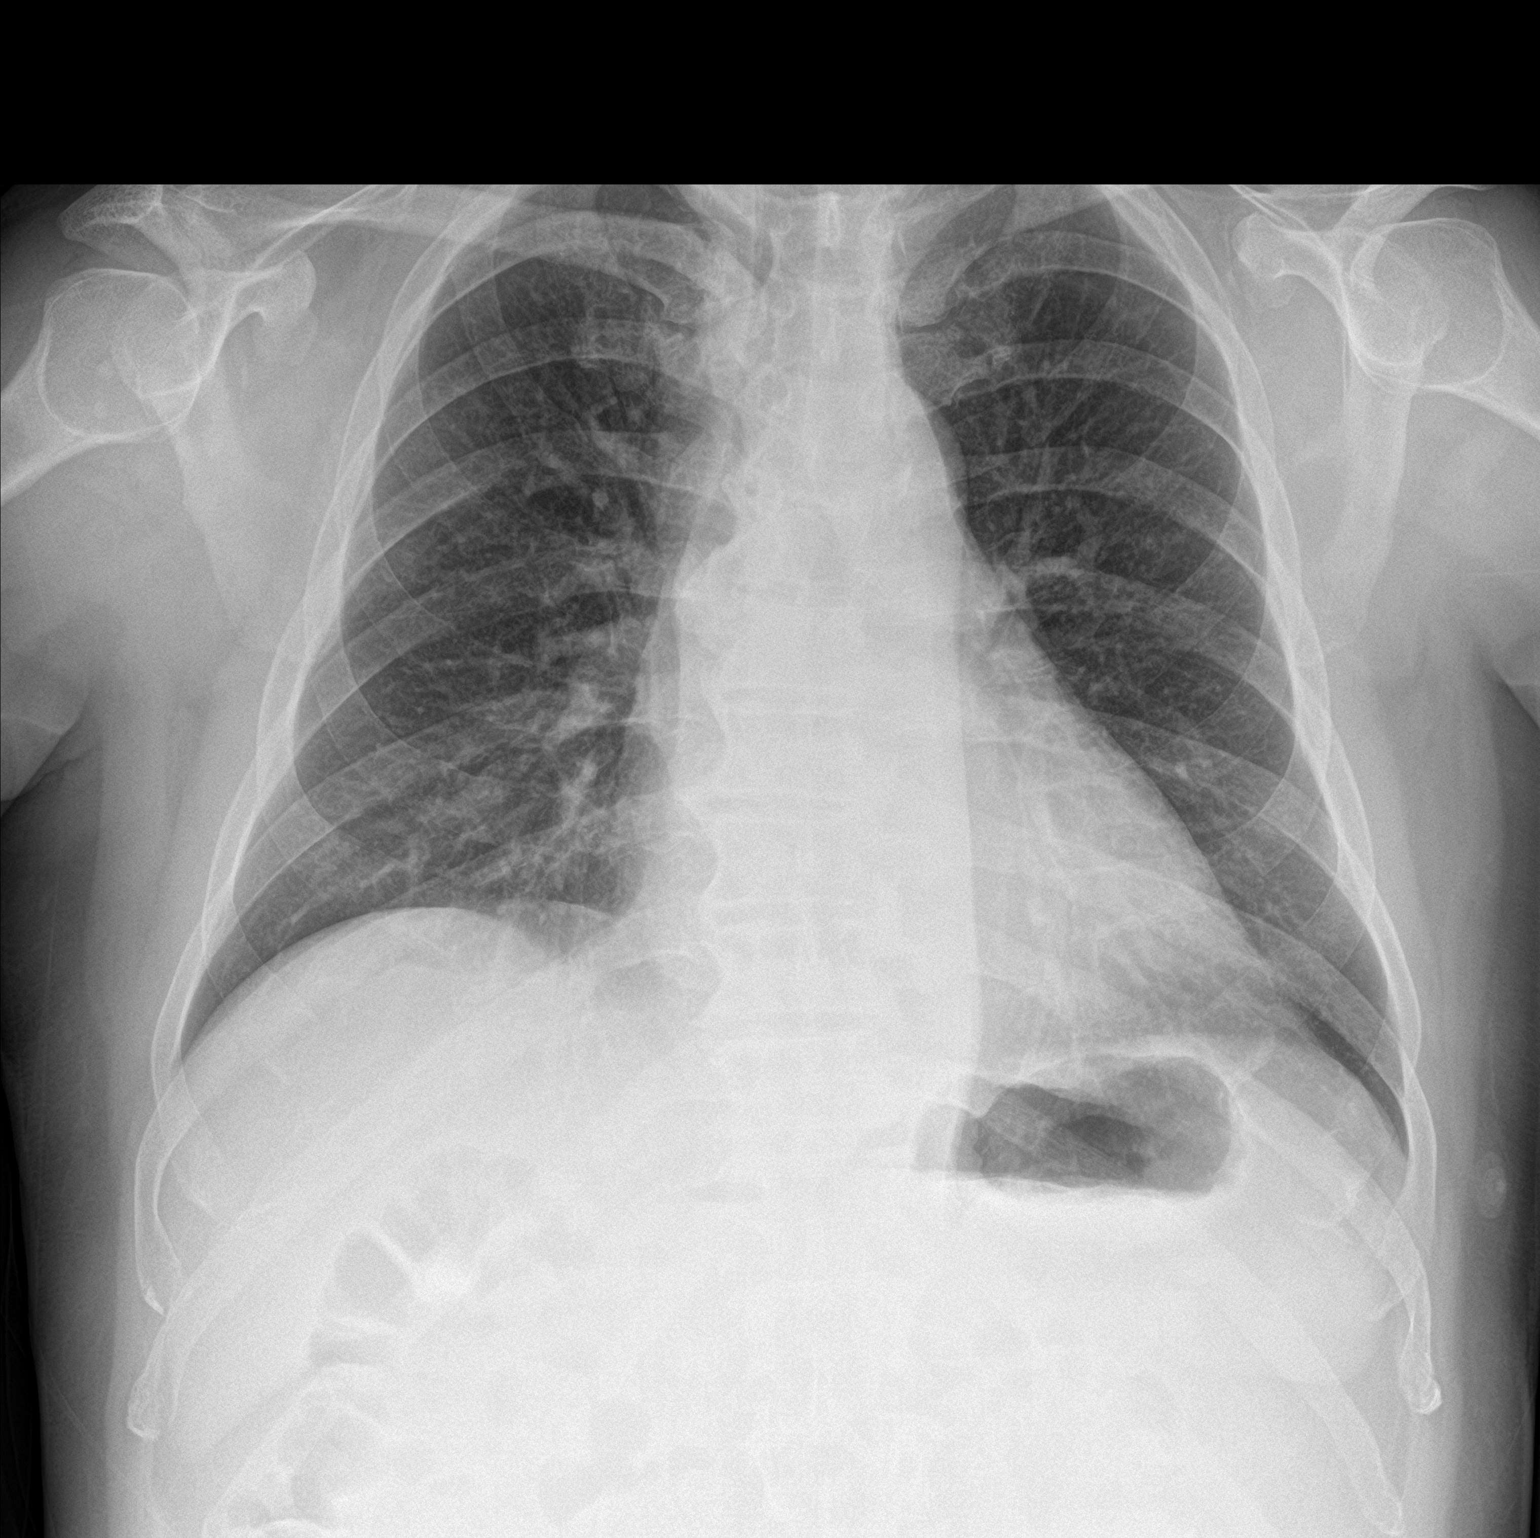

[chest lat]
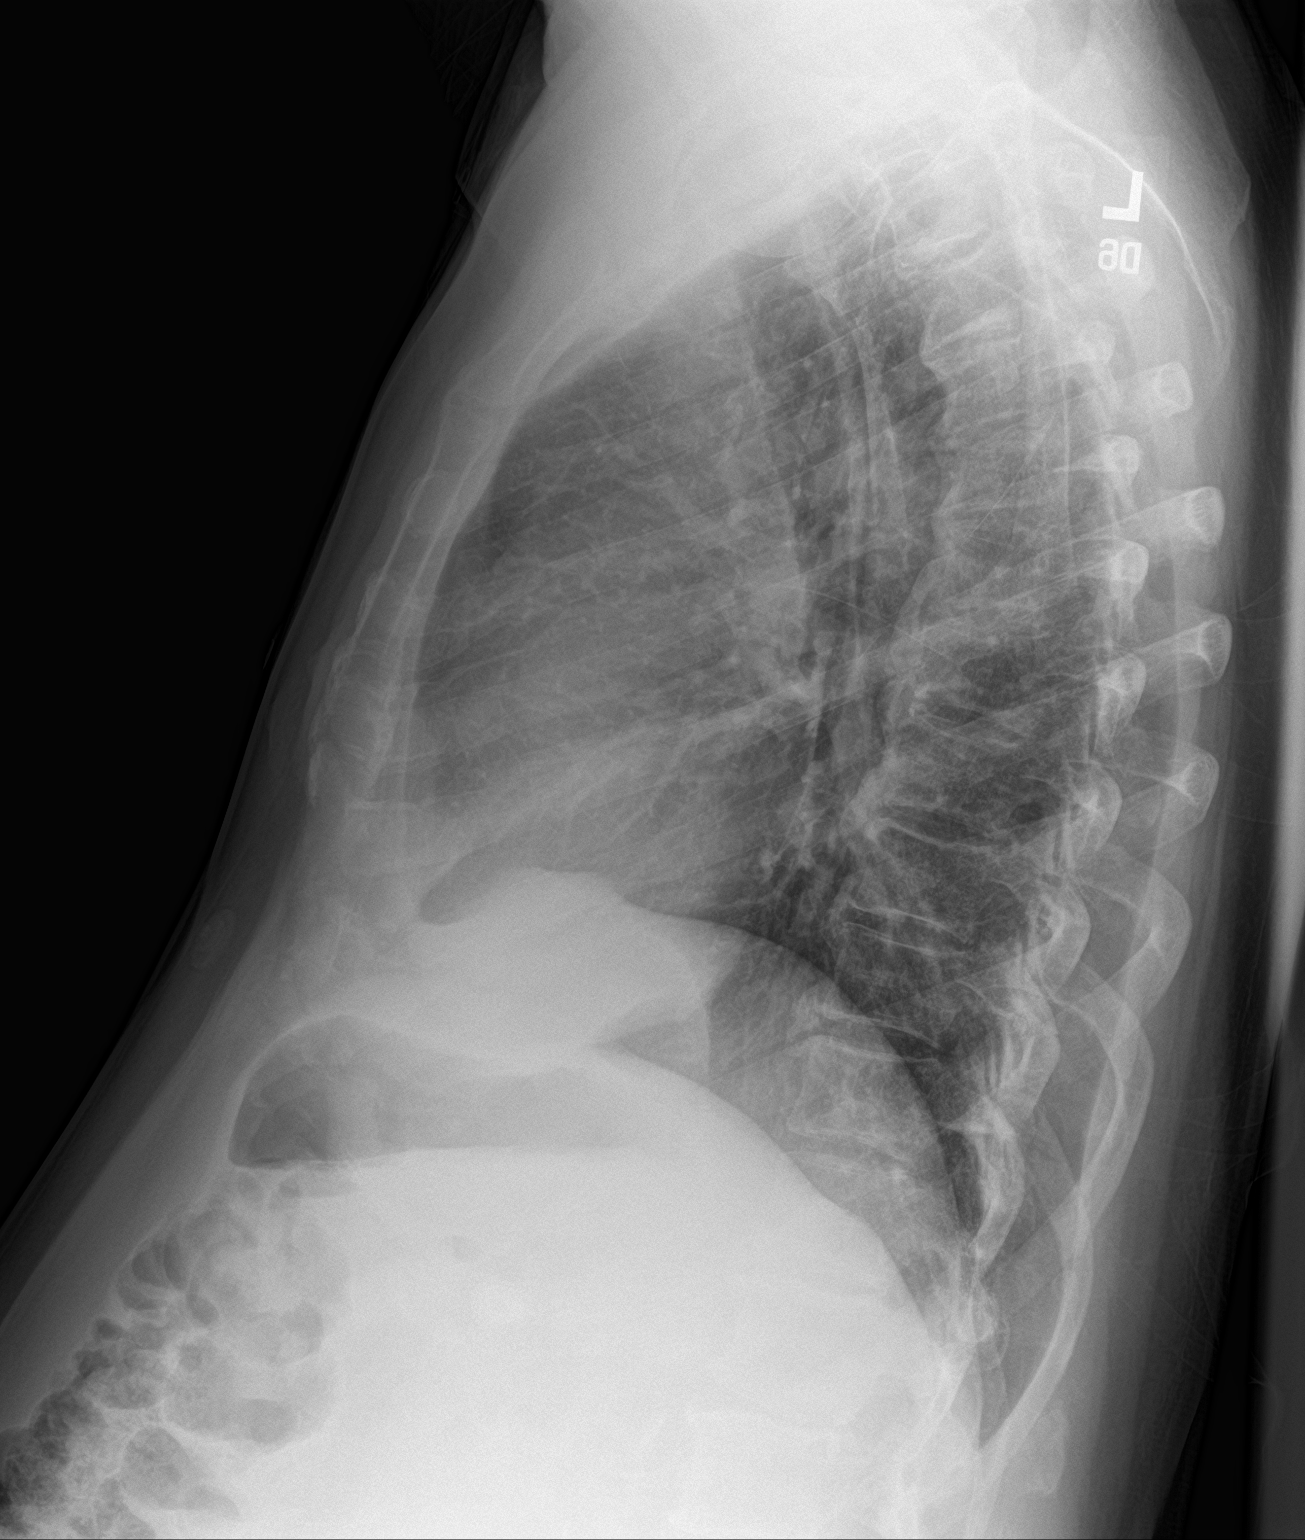

[2 of 2 positions shown; findings below may reference images not displayed]

FINDINGS: The heart size and mediastinal contours are within normal limits.
Both lungs are clear. Disc degenerative disease of the thoracic
spine.
IMPRESSION: No acute abnormality of the lungs.

## 2021-10-25 ENCOUNTER — Telehealth: Payer: Self-pay | Admitting: Cardiology

## 2021-10-25 ENCOUNTER — Other Ambulatory Visit: Payer: Self-pay

## 2021-10-25 MED ORDER — FLECAINIDE ACETATE 100 MG PO TABS: 100.0000 mg | ORAL_TABLET | Freq: Two times a day (BID) | ORAL | 0 refills | Status: DC

## 2021-10-25 NOTE — Telephone Encounter (Signed)
*  STAT* If patient is at the pharmacy, call can be transferred to refill team.   1. Which medications need to be refilled? (please list name of each medication and dose if known)  flecainide (TAMBOCOR) 100 MG tablet  2. Which pharmacy/location (including street and city if local pharmacy) is medication to be sent to? [id number] [place], [place] - [address]  3. Do they need a 30 day or 90 day supply? 30

## 2021-10-25 NOTE — Telephone Encounter (Signed)
Refills sent for Flecainide 100 mg twice a day to pt's preferred pharmacy.

## 2021-12-07 DIAGNOSIS — E785 Hyperlipidemia, unspecified: Secondary | ICD-10-CM | POA: Diagnosis not present

## 2021-12-07 DIAGNOSIS — Z1159 Encounter for screening for other viral diseases: Secondary | ICD-10-CM | POA: Diagnosis not present

## 2021-12-07 DIAGNOSIS — E1169 Type 2 diabetes mellitus with other specified complication: Secondary | ICD-10-CM | POA: Diagnosis not present

## 2021-12-07 DIAGNOSIS — R197 Diarrhea, unspecified: Secondary | ICD-10-CM | POA: Diagnosis not present

## 2021-12-07 DIAGNOSIS — F331 Major depressive disorder, recurrent, moderate: Secondary | ICD-10-CM | POA: Diagnosis not present

## 2021-12-07 DIAGNOSIS — G4733 Obstructive sleep apnea (adult) (pediatric): Secondary | ICD-10-CM | POA: Diagnosis not present

## 2021-12-07 DIAGNOSIS — Z125 Encounter for screening for malignant neoplasm of prostate: Secondary | ICD-10-CM | POA: Diagnosis not present

## 2021-12-07 DIAGNOSIS — Z7984 Long term (current) use of oral hypoglycemic drugs: Secondary | ICD-10-CM | POA: Diagnosis not present

## 2021-12-07 DIAGNOSIS — R69 Illness, unspecified: Secondary | ICD-10-CM | POA: Diagnosis not present

## 2021-12-07 DIAGNOSIS — I10 Essential (primary) hypertension: Secondary | ICD-10-CM | POA: Diagnosis not present

## 2021-12-07 DIAGNOSIS — E559 Vitamin D deficiency, unspecified: Secondary | ICD-10-CM | POA: Diagnosis not present

## 2021-12-07 DIAGNOSIS — Z1389 Encounter for screening for other disorder: Secondary | ICD-10-CM | POA: Diagnosis not present

## 2021-12-07 DIAGNOSIS — I456 Pre-excitation syndrome: Secondary | ICD-10-CM | POA: Diagnosis not present

## 2021-12-07 DIAGNOSIS — Z0000 Encounter for general adult medical examination without abnormal findings: Secondary | ICD-10-CM | POA: Diagnosis not present

## 2021-12-24 DIAGNOSIS — R195 Other fecal abnormalities: Secondary | ICD-10-CM | POA: Diagnosis not present

## 2021-12-28 DIAGNOSIS — R195 Other fecal abnormalities: Secondary | ICD-10-CM | POA: Diagnosis not present

## 2022-01-21 ENCOUNTER — Other Ambulatory Visit: Payer: Self-pay | Admitting: *Deleted

## 2022-01-21 MED ORDER — FLECAINIDE ACETATE 100 MG PO TABS: 100.0000 mg | ORAL_TABLET | Freq: Two times a day (BID) | ORAL | 1 refills | Status: DC

## 2022-02-15 DIAGNOSIS — I10 Essential (primary) hypertension: Secondary | ICD-10-CM | POA: Diagnosis not present

## 2022-02-15 DIAGNOSIS — G4733 Obstructive sleep apnea (adult) (pediatric): Secondary | ICD-10-CM | POA: Diagnosis not present

## 2022-02-15 DIAGNOSIS — E785 Hyperlipidemia, unspecified: Secondary | ICD-10-CM | POA: Diagnosis not present

## 2022-02-15 DIAGNOSIS — E1169 Type 2 diabetes mellitus with other specified complication: Secondary | ICD-10-CM | POA: Diagnosis not present

## 2022-03-10 DIAGNOSIS — G4733 Obstructive sleep apnea (adult) (pediatric): Secondary | ICD-10-CM | POA: Diagnosis not present

## 2022-03-14 DIAGNOSIS — R195 Other fecal abnormalities: Secondary | ICD-10-CM | POA: Diagnosis not present

## 2022-03-24 ENCOUNTER — Ambulatory Visit
Admission: RE | Admit: 2022-03-24 | Discharge: 2022-03-24 | Disposition: A | Discharge status: Home / Self Care | Payer: Medicare HMO | Source: Ambulatory Visit | Attending: Gastroenterology | Admitting: Gastroenterology

## 2022-03-24 ENCOUNTER — Other Ambulatory Visit: Payer: Self-pay | Admitting: Gastroenterology

## 2022-03-24 DIAGNOSIS — R195 Other fecal abnormalities: Secondary | ICD-10-CM

## 2022-03-24 DIAGNOSIS — K8020 Calculus of gallbladder without cholecystitis without obstruction: Secondary | ICD-10-CM | POA: Diagnosis not present

## 2022-03-24 DIAGNOSIS — G4733 Obstructive sleep apnea (adult) (pediatric): Secondary | ICD-10-CM | POA: Diagnosis not present

## 2022-04-19 ENCOUNTER — Telehealth: Payer: Self-pay | Admitting: Cardiology

## 2022-04-19 MED ORDER — FLECAINIDE ACETATE 100 MG PO TABS: 100.0000 mg | ORAL_TABLET | Freq: Two times a day (BID) | ORAL | 0 refills | Status: DC

## 2022-04-19 NOTE — Telephone Encounter (Signed)
*  STAT* If patient is at the pharmacy, call can be transferred to refill team.   1. Which medications need to be refilled? (please list name of each medication and dose if known) Flecainide  2. Which pharmacy/location (including street and city if local pharmacy) is medication to be sent to?[facility]   3. Do they need a 30 day or 90 day supply? 90 days and refills

## 2022-04-19 NOTE — Telephone Encounter (Signed)
Pt's medication was sent to pt's pharmacy as requested. Confirmation received.  °

## 2022-04-23 DIAGNOSIS — G4733 Obstructive sleep apnea (adult) (pediatric): Secondary | ICD-10-CM | POA: Diagnosis not present

## 2022-05-02 DIAGNOSIS — S90819A Abrasion, unspecified foot, initial encounter: Secondary | ICD-10-CM | POA: Diagnosis not present

## 2022-05-02 DIAGNOSIS — S90511A Abrasion, right ankle, initial encounter: Secondary | ICD-10-CM | POA: Diagnosis not present

## 2022-05-03 DIAGNOSIS — Z03818 Encounter for observation for suspected exposure to other biological agents ruled out: Secondary | ICD-10-CM | POA: Diagnosis not present

## 2022-05-03 DIAGNOSIS — R059 Cough, unspecified: Secondary | ICD-10-CM | POA: Diagnosis not present

## 2022-05-03 DIAGNOSIS — R070 Pain in throat: Secondary | ICD-10-CM | POA: Diagnosis not present

## 2022-05-03 DIAGNOSIS — J040 Acute laryngitis: Secondary | ICD-10-CM | POA: Diagnosis not present

## 2022-05-03 DIAGNOSIS — J3489 Other specified disorders of nose and nasal sinuses: Secondary | ICD-10-CM | POA: Diagnosis not present

## 2022-05-07 DIAGNOSIS — Z6829 Body mass index (BMI) 29.0-29.9, adult: Secondary | ICD-10-CM | POA: Diagnosis not present

## 2022-05-07 DIAGNOSIS — R030 Elevated blood-pressure reading, without diagnosis of hypertension: Secondary | ICD-10-CM | POA: Diagnosis not present

## 2022-05-07 DIAGNOSIS — H109 Unspecified conjunctivitis: Secondary | ICD-10-CM | POA: Diagnosis not present

## 2022-05-09 DIAGNOSIS — R058 Other specified cough: Secondary | ICD-10-CM | POA: Diagnosis not present

## 2022-05-09 DIAGNOSIS — J069 Acute upper respiratory infection, unspecified: Secondary | ICD-10-CM | POA: Diagnosis not present

## 2022-05-18 DIAGNOSIS — E785 Hyperlipidemia, unspecified: Secondary | ICD-10-CM | POA: Diagnosis not present

## 2022-05-18 DIAGNOSIS — I10 Essential (primary) hypertension: Secondary | ICD-10-CM | POA: Diagnosis not present

## 2022-05-18 DIAGNOSIS — E1169 Type 2 diabetes mellitus with other specified complication: Secondary | ICD-10-CM | POA: Diagnosis not present

## 2022-05-24 DIAGNOSIS — G4733 Obstructive sleep apnea (adult) (pediatric): Secondary | ICD-10-CM | POA: Diagnosis not present

## 2022-06-10 DIAGNOSIS — R21 Rash and other nonspecific skin eruption: Secondary | ICD-10-CM | POA: Diagnosis not present

## 2022-06-16 DIAGNOSIS — E785 Hyperlipidemia, unspecified: Secondary | ICD-10-CM | POA: Diagnosis not present

## 2022-06-16 DIAGNOSIS — I10 Essential (primary) hypertension: Secondary | ICD-10-CM | POA: Diagnosis not present

## 2022-06-16 DIAGNOSIS — E1169 Type 2 diabetes mellitus with other specified complication: Secondary | ICD-10-CM | POA: Diagnosis not present

## 2022-06-16 DIAGNOSIS — G4733 Obstructive sleep apnea (adult) (pediatric): Secondary | ICD-10-CM | POA: Diagnosis not present

## 2022-06-20 ENCOUNTER — Telehealth: Payer: Self-pay | Admitting: Internal Medicine

## 2022-06-20 MED ORDER — FLECAINIDE ACETATE 100 MG PO TABS: 100.0000 mg | ORAL_TABLET | Freq: Two times a day (BID) | ORAL | 0 refills | Status: DC

## 2022-06-20 NOTE — Telephone Encounter (Signed)
Pt's medication was sent to pt's pharmacy as requested. Confirmation received.  °

## 2022-06-20 NOTE — Telephone Encounter (Signed)
*  STAT* If patient is at the pharmacy, call can be transferred to refill team.   1. Which medications need to be refilled? (please list name of each medication and dose if known) flecainide (TAMBOCOR) 100 MG tablet  2. Which pharmacy/location (including street and city if local pharmacy) is medication to be sent to? [facility] - [place], [place] - [address] [facility] AT Portal to Registered [facility]  3. Do they need a 30 day or 90 day supply? 90

## 2022-06-23 DIAGNOSIS — G4733 Obstructive sleep apnea (adult) (pediatric): Secondary | ICD-10-CM | POA: Diagnosis not present

## 2022-07-12 DIAGNOSIS — L309 Dermatitis, unspecified: Secondary | ICD-10-CM | POA: Diagnosis not present

## 2022-07-20 DIAGNOSIS — H524 Presbyopia: Secondary | ICD-10-CM | POA: Diagnosis not present

## 2022-07-24 DIAGNOSIS — G4733 Obstructive sleep apnea (adult) (pediatric): Secondary | ICD-10-CM | POA: Diagnosis not present

## 2022-07-27 DIAGNOSIS — R195 Other fecal abnormalities: Secondary | ICD-10-CM | POA: Diagnosis not present

## 2022-07-27 DIAGNOSIS — R21 Rash and other nonspecific skin eruption: Secondary | ICD-10-CM | POA: Diagnosis not present

## 2022-07-27 DIAGNOSIS — L989 Disorder of the skin and subcutaneous tissue, unspecified: Secondary | ICD-10-CM | POA: Diagnosis not present

## 2022-07-28 DIAGNOSIS — I10 Essential (primary) hypertension: Secondary | ICD-10-CM | POA: Diagnosis not present

## 2022-07-28 DIAGNOSIS — R69 Illness, unspecified: Secondary | ICD-10-CM | POA: Diagnosis not present

## 2022-07-28 DIAGNOSIS — G4733 Obstructive sleep apnea (adult) (pediatric): Secondary | ICD-10-CM | POA: Diagnosis not present

## 2022-07-28 DIAGNOSIS — E785 Hyperlipidemia, unspecified: Secondary | ICD-10-CM | POA: Diagnosis not present

## 2022-07-28 DIAGNOSIS — E1169 Type 2 diabetes mellitus with other specified complication: Secondary | ICD-10-CM | POA: Diagnosis not present

## 2022-07-29 DIAGNOSIS — F331 Major depressive disorder, recurrent, moderate: Secondary | ICD-10-CM | POA: Diagnosis not present

## 2022-07-29 DIAGNOSIS — R69 Illness, unspecified: Secondary | ICD-10-CM | POA: Diagnosis not present

## 2022-08-02 ENCOUNTER — Encounter: Payer: Self-pay | Admitting: Internal Medicine

## 2022-08-02 ENCOUNTER — Ambulatory Visit: Payer: Medicare HMO | Attending: Internal Medicine | Admitting: Internal Medicine

## 2022-08-02 VITALS — BP 138/74 | HR 67 | Ht 67.0 in | Wt 190.6 lb

## 2022-08-02 DIAGNOSIS — I456 Pre-excitation syndrome: Secondary | ICD-10-CM

## 2022-08-02 NOTE — Patient Instructions (Signed)
Medication Instructions:  Your physician recommends that you continue on your current medications as directed. Please refer to the Current Medication list given to you today.  *If you need a refill on your cardiac medications before your next appointment, please call your pharmacy*   Lab Work: NONE   If you have labs (blood work) drawn today and your tests are completely normal, you will receive your results only by: MyChart Message (if you have MyChart) OR A paper copy in the mail If you have any lab test that is abnormal or we need to change your treatment, we will call you to review the results.   Testing/Procedures: NONE    Follow-Up: At [facility], you and your health needs are our priority.  As part of our continuing mission to provide you with exceptional heart care, we have created designated Provider Care Teams.  These Care Teams include your primary Cardiologist (physician) and Advanced Practice Providers (APPs -  Physician Assistants and Nurse Practitioners) who all work together to provide you with the care you need, when you need it.  We recommend signing up for the patient portal called "MyChart".  Sign up information is provided on this After Visit Summary.  MyChart is used to connect with patients for Virtual Visits (Telemedicine).  Patients are able to view lab/test results, encounter notes, upcoming appointments, etc.  Non-urgent messages can be sent to your provider as well.   To learn more about what you can do with MyChart, go to [email or web address].    Your next appointment:   1 year(s)  The format for your next appointment:   In Person  Provider:   Gregg Taylor, MD    Other Instructions Thank you for choosing [facility]!    Important Information About Sugar       

## 2022-08-02 NOTE — Progress Notes (Signed)
HPI [name] returns today for followup of palpitations and ventricular pre-excitation. He underwent attempted ablation 6 years ago in [facility] but was told that his pathway was said to be too close to his AV node and he was not ablated. Since then he has been on flecainide and his symptoms are mostly controlled. He denies syncope.  No Known Allergies   Current Outpatient Medications  Medication Sig Dispense Refill   cholecalciferol (VITAMIN D3) 25 MCG (1000 UNIT) tablet Take 1,000 Units by mouth daily.     flecainide (TAMBOCOR) 100 MG tablet Take 1 tablet (100 mg total) by mouth 2 (two) times daily. 180 tablet 0   lisinopril (ZESTRIL) 20 MG tablet Take 20 mg by mouth daily.     metFORMIN (GLUCOPHAGE-XR) 500 MG 24 hr tablet Take 500 mg by mouth daily with breakfast.     metoprolol tartrate (LOPRESSOR) 50 MG tablet Take 50 mg by mouth 2 (two) times daily.     atorvastatin (LIPITOR) 40 MG tablet Take 40 mg by mouth daily. (Patient not taking: Reported on 08/02/2022)     No current facility-administered medications for this visit.     Past Medical History:  Diagnosis Date   DM (diabetes mellitus) (HCC)    Hyperlipidemia    Hypertension    Left sciatic nerve pain    OSA (obstructive sleep apnea)    Vitamin D deficiency    WPW (Wolff-Parkinson-White syndrome)     ROS:   All systems reviewed and negative except as noted in the HPI.   Past Surgical History:  Procedure Laterality Date   APPENDECTOMY       Family History  Problem Relation Age of Onset   Hyperlipidemia Mother    Diabetes Father      Social History   Socioeconomic History   Marital status: Single    Spouse name: Not on file   Number of children: Not on file   Years of education: Not on file   Highest education level: Not on file  Occupational History   Not on file  Tobacco Use   Smoking status: Never   Smokeless tobacco: Never  Vaping Use   Vaping Use: Never used  Substance and Sexual  Activity   Alcohol use: Yes    Comment: occasionally   Drug use: Never   Sexual activity: Not on file  Other Topics Concern   Not on file  Social History Narrative   Not on file   Social Determinants of Health   Financial Resource Strain: Not on file  Food Insecurity: Not on file  Transportation Needs: Not on file  Physical Activity: Not on file  Stress: Not on file  Social Connections: Not on file  Intimate Partner Violence: Not on file     BP 138/74   Ht 5\' 7"  (1.702 m)   Wt 190 lb 9.6 oz (86.5 kg)   SpO2 97%   BMI 29.85 kg/m   Physical Exam:  Well appearing NAD HEENT: Unremarkable Neck:  No JVD, no thyromegally Lymphatics:  No adenopathy Back:  No CVA tenderness Lungs:  Clear with no wheezes HEART:  Regular rate rhythm, no murmurs, no rubs, no clicks Abd:  soft, positive bowel sounds, no organomegally, no rebound, no guarding Ext:  2 plus pulses, no edema, no cyanosis, no clubbing Skin:  No rashes no nodules Neuro:  CN II through XII intact, motor grossly intact  EKG - nsr with no pre-excitation   Assess/Plan:  WPW - he has no pre-excitation on his ECG. He appears to be well controlled on flecainide. I discussed catheter ablation and PPM and recommend that he continue the flecainide. HTN - his bp is well controlled. No change in meds. Dyslipidemia -he will continue his statin therapy   Dorathy Daft

## 2022-08-12 DIAGNOSIS — L821 Other seborrheic keratosis: Secondary | ICD-10-CM | POA: Diagnosis not present

## 2022-08-12 DIAGNOSIS — L301 Dyshidrosis [pompholyx]: Secondary | ICD-10-CM | POA: Diagnosis not present

## 2022-08-24 DIAGNOSIS — G4733 Obstructive sleep apnea (adult) (pediatric): Secondary | ICD-10-CM | POA: Diagnosis not present

## 2022-08-25 DIAGNOSIS — E785 Hyperlipidemia, unspecified: Secondary | ICD-10-CM | POA: Diagnosis not present

## 2022-08-25 DIAGNOSIS — R69 Illness, unspecified: Secondary | ICD-10-CM | POA: Diagnosis not present

## 2022-08-25 DIAGNOSIS — G4733 Obstructive sleep apnea (adult) (pediatric): Secondary | ICD-10-CM | POA: Diagnosis not present

## 2022-08-25 DIAGNOSIS — E1169 Type 2 diabetes mellitus with other specified complication: Secondary | ICD-10-CM | POA: Diagnosis not present

## 2022-08-25 DIAGNOSIS — I10 Essential (primary) hypertension: Secondary | ICD-10-CM | POA: Diagnosis not present

## 2022-08-26 DIAGNOSIS — R69 Illness, unspecified: Secondary | ICD-10-CM | POA: Diagnosis not present

## 2022-08-26 DIAGNOSIS — F331 Major depressive disorder, recurrent, moderate: Secondary | ICD-10-CM | POA: Diagnosis not present

## 2022-09-08 DIAGNOSIS — G4733 Obstructive sleep apnea (adult) (pediatric): Secondary | ICD-10-CM | POA: Diagnosis not present

## 2022-09-20 DIAGNOSIS — L301 Dyshidrosis [pompholyx]: Secondary | ICD-10-CM | POA: Diagnosis not present

## 2022-09-23 DIAGNOSIS — G4733 Obstructive sleep apnea (adult) (pediatric): Secondary | ICD-10-CM | POA: Diagnosis not present

## 2022-10-04 DIAGNOSIS — I951 Orthostatic hypotension: Secondary | ICD-10-CM | POA: Diagnosis not present

## 2022-10-04 DIAGNOSIS — E119 Type 2 diabetes mellitus without complications: Secondary | ICD-10-CM | POA: Diagnosis not present

## 2022-10-04 DIAGNOSIS — R69 Illness, unspecified: Secondary | ICD-10-CM | POA: Diagnosis not present

## 2022-10-04 DIAGNOSIS — Z008 Encounter for other general examination: Secondary | ICD-10-CM | POA: Diagnosis not present

## 2022-10-04 DIAGNOSIS — E785 Hyperlipidemia, unspecified: Secondary | ICD-10-CM | POA: Diagnosis not present

## 2022-10-04 DIAGNOSIS — I472 Ventricular tachycardia, unspecified: Secondary | ICD-10-CM | POA: Diagnosis not present

## 2022-10-04 DIAGNOSIS — Z7984 Long term (current) use of oral hypoglycemic drugs: Secondary | ICD-10-CM | POA: Diagnosis not present

## 2022-10-04 DIAGNOSIS — I10 Essential (primary) hypertension: Secondary | ICD-10-CM | POA: Diagnosis not present

## 2022-10-24 DIAGNOSIS — G4733 Obstructive sleep apnea (adult) (pediatric): Secondary | ICD-10-CM | POA: Diagnosis not present

## 2022-11-11 DIAGNOSIS — F324 Major depressive disorder, single episode, in partial remission: Secondary | ICD-10-CM | POA: Diagnosis not present

## 2022-11-11 DIAGNOSIS — R69 Illness, unspecified: Secondary | ICD-10-CM | POA: Diagnosis not present

## 2022-11-23 DIAGNOSIS — G4733 Obstructive sleep apnea (adult) (pediatric): Secondary | ICD-10-CM | POA: Diagnosis not present

## 2023-01-06 DIAGNOSIS — E119 Type 2 diabetes mellitus without complications: Secondary | ICD-10-CM | POA: Diagnosis not present

## 2023-01-06 DIAGNOSIS — I10 Essential (primary) hypertension: Secondary | ICD-10-CM | POA: Diagnosis not present

## 2023-01-06 DIAGNOSIS — R69 Illness, unspecified: Secondary | ICD-10-CM | POA: Diagnosis not present

## 2023-01-06 DIAGNOSIS — Z0000 Encounter for general adult medical examination without abnormal findings: Secondary | ICD-10-CM | POA: Diagnosis not present

## 2023-01-06 DIAGNOSIS — E559 Vitamin D deficiency, unspecified: Secondary | ICD-10-CM | POA: Diagnosis not present

## 2023-01-06 DIAGNOSIS — G4733 Obstructive sleep apnea (adult) (pediatric): Secondary | ICD-10-CM | POA: Diagnosis not present

## 2023-01-06 DIAGNOSIS — I456 Pre-excitation syndrome: Secondary | ICD-10-CM | POA: Diagnosis not present

## 2023-01-06 DIAGNOSIS — E785 Hyperlipidemia, unspecified: Secondary | ICD-10-CM | POA: Diagnosis not present

## 2023-01-06 DIAGNOSIS — Z125 Encounter for screening for malignant neoplasm of prostate: Secondary | ICD-10-CM | POA: Diagnosis not present

## 2023-01-06 DIAGNOSIS — E1169 Type 2 diabetes mellitus with other specified complication: Secondary | ICD-10-CM | POA: Diagnosis not present

## 2023-01-09 ENCOUNTER — Telehealth: Payer: Self-pay | Admitting: *Deleted

## 2023-01-09 ENCOUNTER — Telehealth: Payer: Self-pay

## 2023-01-09 NOTE — Telephone Encounter (Signed)
   Name: Glenn Pope  DOB: Mar 27, 1951  MRN: [id number]  Primary Cardiologist: None   Preoperative team, please contact this patient and set up a phone call appointment for further preoperative risk assessment. Please obtain consent and complete medication review. Thank you for your help.  I confirm that guidance regarding antiplatelet and oral anticoagulation therapy has been completed and, if necessary, noted below.  No medications requested to be held.   Mable Fill, Marissa Nestle, NP 01/09/2023, 1:38 PM [place]

## 2023-01-09 NOTE — Telephone Encounter (Signed)
CARDIOLOGIST IS DR. Lovena Le. Pt has been scheduled for tele pre op appt 01/13/23 @ 2:20. Med rec and consent are done.     Patient Consent for Virtual Visit        Glenn Pope has provided verbal consent on 01/09/2023 for a virtual visit (video or telephone).   CONSENT FOR VIRTUAL VISIT FOR:  Glenn Pope  By participating in this virtual visit I agree to the following:  I hereby voluntarily request, consent and authorize [facility] and its employed or contracted physicians, physician assistants, nurse practitioners or other licensed health care professionals (the Practitioner), to provide me with telemedicine health care services (the "Services") as deemed necessary by the treating Practitioner. I acknowledge and consent to receive the Services by the Practitioner via telemedicine. I understand that the telemedicine visit will involve communicating with the Practitioner through live audiovisual communication technology and the disclosure of certain medical information by electronic transmission. I acknowledge that I have been given the opportunity to request an in-person assessment or other available alternative prior to the telemedicine visit and am voluntarily participating in the telemedicine visit.  I understand that I have the right to withhold or withdraw my consent to the use of telemedicine in the course of my care at any time, without affecting my right to future care or treatment, and that the Practitioner or I may terminate the telemedicine visit at any time. I understand that I have the right to inspect all information obtained and/or recorded in the course of the telemedicine visit and may receive copies of available information for a reasonable fee.  I understand that some of the potential risks of receiving the Services via telemedicine include:  Delay or interruption in medical evaluation due to technological equipment failure or  disruption; Information transmitted may not be sufficient (e.g. poor resolution of images) to allow for appropriate medical decision making by the Practitioner; and/or  In rare instances, security protocols could fail, causing a breach of personal health information.  Furthermore, I acknowledge that it is my responsibility to provide information about my medical history, conditions and care that is complete and accurate to the best of my ability. I acknowledge that Practitioner's advice, recommendations, and/or decision may be based on factors not within their control, such as incomplete or inaccurate data provided by me or distortions of diagnostic images or specimens that may result from electronic transmissions. I understand that the practice of medicine is not an exact science and that Practitioner makes no warranties or guarantees regarding treatment outcomes. I acknowledge that a copy of this consent can be made available to me via my patient portal ([facility]), or I can request a printed copy by calling the office of [place].    I understand that my insurance will be billed for this visit.   I have read or had this consent read to me. I understand the contents of this consent, which adequately explains the benefits and risks of the Services being provided via telemedicine.  I have been provided ample opportunity to ask questions regarding this consent and the Services and have had my questions answered to my satisfaction. I give my informed consent for the services to be provided through the use of telemedicine in my medical care

## 2023-01-09 NOTE — Telephone Encounter (Signed)
   Pre-operative Risk Assessment    Patient Name: Glenn Pope  DOB: Jul 24, 1951 MRN: [id number]     Request for Surgical Clearance    Procedure:  Dental Extraction - Amount of Teeth to be Pulled:  6  Date of Surgery:  Clearance TBD                                 Surgeon: DR. Veva Holes Surgeon's Group or Practice Name:  [facility]  Phone number:  [phone] Fax number:  [phone]   Type of Clearance Requested:   - Medical    Type of Anesthesia:  Local    Additional requests/questions:    SignedJacinta Shoe   01/09/2023, 1:28 PM

## 2023-01-09 NOTE — Telephone Encounter (Signed)
CARDIOLOGIST IS DR. Lovena Le. Pt has been scheduled for tele pre op appt 01/13/23 @ 2:20. Med rec and consent are done.

## 2023-01-13 ENCOUNTER — Ambulatory Visit: Payer: Medicare HMO | Attending: Internal Medicine | Admitting: Student

## 2023-01-13 DIAGNOSIS — Z01810 Encounter for preprocedural cardiovascular examination: Secondary | ICD-10-CM

## 2023-01-13 NOTE — Progress Notes (Signed)
Virtual Visit via Telephone Note   Because of Glenn Pope's co-morbid illnesses, he is at least at moderate risk for complications without adequate follow up.  This format is felt to be most appropriate for this patient at this time.  The patient did not have access to video technology/had technical difficulties with video requiring transitioning to audio format only (telephone).  All issues noted in this document were discussed and addressed.  No physical exam could be performed with this format.  Please refer to the patient's chart for his consent to telehealth for [facility].  Evaluation Performed:  Preoperative cardiovascular risk assessment _____________   Date:  01/13/2023   Patient ID:  Pope, Glenn 01/03/1951, MRN [id number] Patient Location:  Home Provider location:   Office  Primary Care Provider:  Donald Prose, MD Primary Cardiologist:  None  Chief Complaint / Patient Profile   72 y.o. y/o male with a h/o WPW on flecainide, hypertension, dyslipidemia who is pending extraction of 6 teeth and presents today for telephonic preoperative cardiovascular risk assessment.  History of Present Illness    Glenn Pope is a 72 y.o. male who presents via audio/video conferencing for a telehealth visit today.  Pt was last seen in cardiology clinic on 08/02/2022 by Dr. Lovena Le.  At that time Jaizen Robin was doing well.  The patient is now pending procedure as outlined above. Since his last visit, he is doing very well. Patient denies shortness of breath or dyspnea on exertion. No chest pain, pressure, or tightness. Denies lower extremity edema, orthopnea, or PND. No palpitations. He does not do formal exercise but stays active in his home doing heavy housework such as mopping, vacuuming, and sweeping. He also does laundry and cooks,   Past Medical History    Past Medical History:  Diagnosis Date   DM (diabetes mellitus) ([place])     Hyperlipidemia    Hypertension    Left sciatic nerve pain    OSA (obstructive sleep apnea)    Vitamin D deficiency    WPW (Wolff-Parkinson-White syndrome)    Past Surgical History:  Procedure Laterality Date   APPENDECTOMY      Allergies  No Known Allergies  Home Medications    Prior to Admission medications   Medication Sig Start Date End Date Taking? Authorizing Provider  atorvastatin (LIPITOR) 40 MG tablet Take 40 mg by mouth daily. Patient not taking: Reported on 08/02/2022    [provider]  cholecalciferol (VITAMIN D3) 25 MCG (1000 UNIT) tablet Take 1,000 Units by mouth daily.    [provider]  flecainide (TAMBOCOR) 100 MG tablet Take 1 tablet (100 mg total) by mouth 2 (two) times daily. Patient taking differently: Take 50 mg by mouth 2 (two) times daily. 06/20/22   Evans Lance, MD  lisinopril (ZESTRIL) 20 MG tablet Take 20 mg by mouth daily.    [provider]  metFORMIN (GLUCOPHAGE-XR) 500 MG 24 hr tablet Take 500 mg by mouth 2 (two) times daily with a meal.    [provider]  metoprolol tartrate (LOPRESSOR) 50 MG tablet Take 50 mg by mouth 2 (two) times daily.    [provider]  rosuvastatin (CRESTOR) 20 MG tablet Take 20 mg by mouth daily.    [provider]    Physical Exam    Vital Signs:  Rasool Pennel does not have vital signs available for review today.  Given telephonic nature of communication, physical exam is  limited. AAOx3. NAD. Normal affect.  Speech and respirations are unlabored.  Accessory Clinical Findings    None  Assessment & Plan    Primary Cardiologist: None  Preoperative cardiovascular risk assessment. Extraction of 6 teeth.  Chart reviewed as part of pre-operative protocol coverage. According to the RCRI, patient has a 0.4% risk of MACE. Patient reports activity equivalent to 4.0 METS (heavy housework such as mopping, vacuuming and sweeping as well as laundry and  cooking).   Given past medical history and time since last visit, based on ACC/AHA guidelines, Zi Waterman would be at acceptable risk for the planned procedure without further cardiovascular testing.   Patient was advised that if he develops new symptoms prior to surgery to contact our office to arrange a follow-up appointment.  he verbalized understanding.   I will route this recommendation to the requesting party via Epic fax function.  Please call with questions.  Time:   Today, I have spent 5 minutes with the patient with telehealth technology discussing medical history, symptoms, and management plan.     Mayra Reel, NP  01/13/2023, 2:02 PM

## 2023-02-12 DIAGNOSIS — E11620 Type 2 diabetes mellitus with diabetic dermatitis: Secondary | ICD-10-CM | POA: Diagnosis not present

## 2023-02-12 DIAGNOSIS — G4733 Obstructive sleep apnea (adult) (pediatric): Secondary | ICD-10-CM | POA: Diagnosis not present

## 2023-02-12 DIAGNOSIS — E1159 Type 2 diabetes mellitus with other circulatory complications: Secondary | ICD-10-CM | POA: Diagnosis not present

## 2023-02-12 DIAGNOSIS — G8929 Other chronic pain: Secondary | ICD-10-CM | POA: Diagnosis not present

## 2023-02-12 DIAGNOSIS — N189 Chronic kidney disease, unspecified: Secondary | ICD-10-CM | POA: Diagnosis not present

## 2023-02-12 DIAGNOSIS — K219 Gastro-esophageal reflux disease without esophagitis: Secondary | ICD-10-CM | POA: Diagnosis not present

## 2023-02-12 DIAGNOSIS — I2510 Atherosclerotic heart disease of native coronary artery without angina pectoris: Secondary | ICD-10-CM | POA: Diagnosis not present

## 2023-02-12 DIAGNOSIS — E669 Obesity, unspecified: Secondary | ICD-10-CM | POA: Diagnosis not present

## 2023-02-12 DIAGNOSIS — E785 Hyperlipidemia, unspecified: Secondary | ICD-10-CM | POA: Diagnosis not present

## 2023-02-12 DIAGNOSIS — I129 Hypertensive chronic kidney disease with stage 1 through stage 4 chronic kidney disease, or unspecified chronic kidney disease: Secondary | ICD-10-CM | POA: Diagnosis not present

## 2023-02-12 DIAGNOSIS — E11630 Type 2 diabetes mellitus with periodontal disease: Secondary | ICD-10-CM | POA: Diagnosis not present

## 2023-02-12 DIAGNOSIS — Z008 Encounter for other general examination: Secondary | ICD-10-CM | POA: Diagnosis not present

## 2023-02-12 DIAGNOSIS — Z8249 Family history of ischemic heart disease and other diseases of the circulatory system: Secondary | ICD-10-CM | POA: Diagnosis not present

## 2023-02-13 ENCOUNTER — Telehealth: Payer: Self-pay | Admitting: Internal Medicine

## 2023-02-13 DIAGNOSIS — Z760 Encounter for issue of repeat prescription: Secondary | ICD-10-CM

## 2023-02-13 DIAGNOSIS — I456 Pre-excitation syndrome: Secondary | ICD-10-CM

## 2023-02-13 NOTE — Telephone Encounter (Signed)
*  STAT* If patient is at the pharmacy, call can be transferred to refill team.   1. Which medications need to be refilled? (please list name of each medication and dose if known) Flecainide  2. Which pharmacy/location (including street and city if local pharmacy) is medication to be sent to? [facility]  3. Do they need a 30 day or 90 day supply? 90 days and refills

## 2023-02-13 NOTE — Telephone Encounter (Signed)
Spoke with Pt who states that he takes 1/2 tablet of flecainide two times daily. When asked pt states that he has always taken 1/2 tablet twice daily. Chart states 1 tablet BID. Please advise.

## 2023-02-14 MED ORDER — FLECAINIDE ACETATE 50 MG PO TABS: 50.0000 mg | ORAL_TABLET | Freq: Two times a day (BID) | ORAL | 3 refills | Status: DC

## 2023-02-14 NOTE — Addendum Note (Signed)
Addended by: Oleta Mouse C on: 02/14/2023 11:10 AM   Modules accepted: Orders

## 2023-02-14 NOTE — Telephone Encounter (Signed)
Message received from [facility] LPN regarding Pt need for Flecainide refill.   See my RN note from 02/14/2023 at 944 am.   Pt called and stated he takes Flecainide 50 mg BID, and has always taken this med this way.  He had been halfing tablets.  Pt has 2 weeks left before his script runs out, and [facility].  After consulting, and reviewing Dr. Tanna Furry 07/2022 visit note, I updated Pt's MAR to reflect the correct dosage of Flecainide, and sent the refill request to [facility].    Pt called and educated on the dose change, and that he did NOT need to half the tablet any longer.  Pt instructed to take Flecainide 50 mg, by mouth, in the morning and evening.  Pt told to read the instructions on the bottle when he receives the medication.  Pt understood education, and order NOT to half the new tablet.   Pt advised to reach out to [facility] with any questions or concerns.  Pt understood and appreciated the help refilling his prescription.

## 2023-02-14 NOTE — Telephone Encounter (Signed)
Called Pt back per message received through [facility] Triage.    Pt states that he has been halfing his Flecainide, only taking 50 mg in the morning and 50 mg in the evening.    Pt order is to take 1 full tablet, 100 mg, by mouth in the morning and evening.  ( Two times daily. )   Pt states he has been taking it this way since he has been on it, and feels fine.  Pt states he has a little over 2 weeks of the medication left.    Dr. Lovena Le is out on vacation this week;  I will follow up with Dr. Lovena Le, and make him aware of how the patient is taking his Flecainide, and await orders as to how to refill his medicine.

## 2023-02-20 ENCOUNTER — Telehealth: Payer: Self-pay | Admitting: *Deleted

## 2023-02-20 NOTE — Telephone Encounter (Signed)
   Pre-operative Risk Assessment    Patient Name: Glenn Pope  DOB: [birth year] MRN: [id number]     Request for Surgical Clearance    Procedure:  Dental Extraction - Amount of Teeth to be Pulled:  21 TEETH  Date of Surgery:  Clearance TBD                                 Surgeon:   Surgeon's Group or Practice Name:  [facility] Phone number:  [phone] Fax number:  [phone]   Type of Clearance Requested:   - Medical    Type of Anesthesia:   MODERATE SEDATION   Additional requests/questions:    Astrid Divine   02/20/2023, 3:50 PM

## 2023-02-21 NOTE — Telephone Encounter (Signed)
   Primary Cardiologist: None  Chart reviewed as part of pre-operative protocol coverage. Given past medical history and time since last visit, based on ACC/AHA guidelines, Glenn Pope would be at acceptable risk for the planned procedure without further cardiovascular testing.   Patient was advised that if he develops new symptoms prior to surgery to contact our office to arrange a follow-up appointment.  He verbalized understanding.  I will route this recommendation to the requesting party via Epic fax function and remove from pre-op pool.  Please call with questions.  Emmaline Life, NP-C  02/21/2023, 5:28 AM [address] Office [phone] Fax [phone]

## 2023-07-07 DIAGNOSIS — E119 Type 2 diabetes mellitus without complications: Secondary | ICD-10-CM | POA: Diagnosis not present

## 2023-07-07 DIAGNOSIS — I10 Essential (primary) hypertension: Secondary | ICD-10-CM | POA: Diagnosis not present

## 2023-07-07 DIAGNOSIS — E1169 Type 2 diabetes mellitus with other specified complication: Secondary | ICD-10-CM | POA: Diagnosis not present

## 2023-07-07 DIAGNOSIS — I456 Pre-excitation syndrome: Secondary | ICD-10-CM | POA: Diagnosis not present

## 2023-07-07 DIAGNOSIS — E785 Hyperlipidemia, unspecified: Secondary | ICD-10-CM | POA: Diagnosis not present

## 2023-08-03 DIAGNOSIS — M5432 Sciatica, left side: Secondary | ICD-10-CM | POA: Diagnosis not present

## 2023-08-10 ENCOUNTER — Ambulatory Visit: Payer: Medicare HMO | Attending: Internal Medicine | Admitting: Internal Medicine

## 2023-08-10 ENCOUNTER — Encounter: Payer: Self-pay | Admitting: Internal Medicine

## 2023-08-10 VITALS — BP 128/72 | HR 51 | Ht 66.0 in | Wt 187.8 lb

## 2023-08-10 DIAGNOSIS — I456 Pre-excitation syndrome: Secondary | ICD-10-CM | POA: Diagnosis not present

## 2023-08-10 NOTE — Progress Notes (Signed)
HPI [name] returns today for followup of palpitations and ventricular pre-excitation. He underwent attempted ablation 6 years ago in [facility] but was told that his pathway was said to be too close to his AV node and he was not ablated. Since then he has been on flecainide and his symptoms are mostly controlled. He denies syncope.  he has had multiple teeth removed and is undergoing treatment of his sleep apnea. He has had trouble wearing CPAP. He has had his lower teeth removed. No Known Allergies   Current Outpatient Medications  Medication Sig Dispense Refill   cholecalciferol (VITAMIN D3) 25 MCG (1000 UNIT) tablet Take 1,000 Units by mouth daily.     flecainide (TAMBOCOR) 50 MG tablet Take 1 tablet (50 mg total) by mouth 2 (two) times daily. 180 tablet 3   lisinopril (ZESTRIL) 20 MG tablet Take 20 mg by mouth daily.     metFORMIN (GLUCOPHAGE-XR) 500 MG 24 hr tablet Take 500 mg by mouth 2 (two) times daily with a meal.     metoprolol tartrate (LOPRESSOR) 50 MG tablet Take 50 mg by mouth 2 (two) times daily.     rosuvastatin (CRESTOR) 20 MG tablet Take 20 mg by mouth daily.     No current facility-administered medications for this visit.     Past Medical History:  Diagnosis Date   DM (diabetes mellitus) (HCC)    Hyperlipidemia    Hypertension    Left sciatic nerve pain    OSA (obstructive sleep apnea)    Vitamin D deficiency    WPW (Wolff-Parkinson-White syndrome)     ROS:   All systems reviewed and negative except as noted in the HPI.   Past Surgical History:  Procedure Laterality Date   APPENDECTOMY       Family History  Problem Relation Age of Onset   Hyperlipidemia Mother    Diabetes Father      Social History   Socioeconomic History   Marital status: Single    Spouse name: Not on file   Number of children: Not on file   Years of education: Not on file   Highest education level: Not on file  Occupational History   Not on file  Tobacco  Use   Smoking status: Never   Smokeless tobacco: Never  Vaping Use   Vaping status: Never Used  Substance and Sexual Activity   Alcohol use: Yes    Comment: occasionally   Drug use: Never   Sexual activity: Not on file  Other Topics Concern   Not on file  Social History Narrative   Not on file   Social Determinants of Health   Financial Resource Strain: Not on file  Food Insecurity: Not on file  Transportation Needs: Not on file  Physical Activity: Not on file  Stress: Not on file  Social Connections: Not on file  Intimate Partner Violence: Not on file     BP 128/72   Pulse (!) 51   Ht 5\' 6"  (1.676 m)   Wt 187 lb 12.8 oz (85.2 kg)   SpO2 98%   BMI 30.31 kg/m   Physical Exam:  Well appearing NAD HEENT: Unremarkable Neck:  No JVD, no thyromegally Lymphatics:  No adenopathy Back:  No CVA tenderness Lungs:  Clear with no wheezes HEART:  Regular rate rhythm, no murmurs, no rubs, no clicks Abd:  soft, positive bowel sounds, no organomegally, no rebound, no guarding Ext:  2 plus pulses, no edema, no  cyanosis, no clubbing Skin:  No rashes no nodules Neuro:  CN II through XII intact, motor grossly intact  EKG - nsr with no pre-excitation   Assess/Plan: WPW syndrome (anteroseptal pathway) - he has done well with flecainide. He has no pre-excitation on his ECG today. He will continue the flecainide and undergo watchful waiting HTN - his bp is controlled. We will follow. Continue metoprolol. Dyslipidemia - he will continue crestor.   Glenn Gowda Kendre Jacinto,MD

## 2023-08-10 NOTE — Patient Instructions (Signed)

## 2023-08-22 DIAGNOSIS — M5416 Radiculopathy, lumbar region: Secondary | ICD-10-CM | POA: Diagnosis not present

## 2023-08-23 DIAGNOSIS — M5416 Radiculopathy, lumbar region: Secondary | ICD-10-CM | POA: Diagnosis not present

## 2023-08-29 DIAGNOSIS — M5416 Radiculopathy, lumbar region: Secondary | ICD-10-CM | POA: Diagnosis not present

## 2023-09-01 DIAGNOSIS — M5416 Radiculopathy, lumbar region: Secondary | ICD-10-CM | POA: Diagnosis not present

## 2023-12-04 DIAGNOSIS — I4891 Unspecified atrial fibrillation: Secondary | ICD-10-CM | POA: Diagnosis not present

## 2023-12-04 DIAGNOSIS — F1421 Cocaine dependence, in remission: Secondary | ICD-10-CM | POA: Diagnosis not present

## 2023-12-04 DIAGNOSIS — I2510 Atherosclerotic heart disease of native coronary artery without angina pectoris: Secondary | ICD-10-CM | POA: Diagnosis not present

## 2023-12-04 DIAGNOSIS — D6869 Other thrombophilia: Secondary | ICD-10-CM | POA: Diagnosis not present

## 2023-12-04 DIAGNOSIS — I472 Ventricular tachycardia, unspecified: Secondary | ICD-10-CM | POA: Diagnosis not present

## 2023-12-04 DIAGNOSIS — Z7984 Long term (current) use of oral hypoglycemic drugs: Secondary | ICD-10-CM | POA: Diagnosis not present

## 2023-12-04 DIAGNOSIS — E785 Hyperlipidemia, unspecified: Secondary | ICD-10-CM | POA: Diagnosis not present

## 2023-12-04 DIAGNOSIS — Z833 Family history of diabetes mellitus: Secondary | ICD-10-CM | POA: Diagnosis not present

## 2023-12-04 DIAGNOSIS — E119 Type 2 diabetes mellitus without complications: Secondary | ICD-10-CM | POA: Diagnosis not present

## 2023-12-04 DIAGNOSIS — F1221 Cannabis dependence, in remission: Secondary | ICD-10-CM | POA: Diagnosis not present

## 2023-12-04 DIAGNOSIS — Z8249 Family history of ischemic heart disease and other diseases of the circulatory system: Secondary | ICD-10-CM | POA: Diagnosis not present

## 2023-12-04 DIAGNOSIS — I10 Essential (primary) hypertension: Secondary | ICD-10-CM | POA: Diagnosis not present

## 2024-01-24 DIAGNOSIS — L309 Dermatitis, unspecified: Secondary | ICD-10-CM | POA: Diagnosis not present

## 2024-05-07 ENCOUNTER — Other Ambulatory Visit: Payer: Self-pay | Admitting: *Deleted

## 2024-05-07 DIAGNOSIS — Z760 Encounter for issue of repeat prescription: Secondary | ICD-10-CM

## 2024-05-07 DIAGNOSIS — I456 Pre-excitation syndrome: Secondary | ICD-10-CM

## 2024-05-07 MED ORDER — FLECAINIDE ACETATE 50 MG PO TABS: 50.0000 mg | ORAL_TABLET | Freq: Two times a day (BID) | ORAL | 1 refills | Status: DC

## 2024-05-17 DIAGNOSIS — F331 Major depressive disorder, recurrent, moderate: Secondary | ICD-10-CM | POA: Diagnosis not present

## 2024-05-17 DIAGNOSIS — F411 Generalized anxiety disorder: Secondary | ICD-10-CM | POA: Diagnosis not present

## 2024-05-27 DIAGNOSIS — E785 Hyperlipidemia, unspecified: Secondary | ICD-10-CM | POA: Diagnosis not present

## 2024-05-27 DIAGNOSIS — E1169 Type 2 diabetes mellitus with other specified complication: Secondary | ICD-10-CM | POA: Diagnosis not present

## 2024-05-27 DIAGNOSIS — I10 Essential (primary) hypertension: Secondary | ICD-10-CM | POA: Diagnosis not present

## 2024-05-27 DIAGNOSIS — F3342 Major depressive disorder, recurrent, in full remission: Secondary | ICD-10-CM | POA: Diagnosis not present

## 2024-06-03 DIAGNOSIS — M5432 Sciatica, left side: Secondary | ICD-10-CM | POA: Diagnosis not present

## 2024-06-16 ENCOUNTER — Encounter (HOSPITAL_COMMUNITY): Payer: Self-pay

## 2024-06-16 ENCOUNTER — Ambulatory Visit (HOSPITAL_COMMUNITY): Admission: EM | Admit: 2024-06-16 | Discharge: 2024-06-16 | Disposition: A | Discharge status: Home / Self Care

## 2024-06-16 DIAGNOSIS — M5432 Sciatica, left side: Secondary | ICD-10-CM

## 2024-06-16 MED ORDER — DEXAMETHASONE SODIUM PHOSPHATE 10 MG/ML IJ SOLN
10.0000 mg | Freq: Once | INTRAMUSCULAR | Status: AC
  Administered 2024-06-16: 10 mg via INTRAMUSCULAR

## 2024-06-16 MED ORDER — DEXAMETHASONE SODIUM PHOSPHATE 10 MG/ML IJ SOLN
INTRAMUSCULAR | Status: AC
  Filled 2024-06-16: qty 1

## 2024-06-16 NOTE — Discharge Instructions (Addendum)
 You were given an injection of Decadron  in clinic today to help reduce inflammation related to your sciatica pain. You can continue to take naproxen and tizanidine as needed for pain as well. You can follow-up with [facility] for further evaluation and management of this. Otherwise follow-up with your primary care provider or return here as needed.

## 2024-06-16 NOTE — ED Triage Notes (Signed)
 Patient here today with c/o left leg pain that radiates down to ankle for a while now. Patient states that he has increased pain with driving in his car. Patient went to the doctor 3 weeks ago and was given prednisone and tizanidine with some relief but the pain came back. Improves with standing.

## 2024-06-16 NOTE — ED Provider Notes (Signed)
 [facility]    CSN: [id number] Arrival date & time: 06/16/24  1238      History   Chief Complaint Chief Complaint  Patient presents with   Leg Pain    HPI Glenn Pope is a 73 y.o. male.   Patient presents with left leg pain that radiates down to his ankle that has been going on for the few months.  Patient states that it increase in pain about 4 weeks ago and he went to his primary care provider 3 weeks ago and was prescribed prednisone and tizanidine at that time.  Patient states that he has been taking tizanidine and naproxen with some relief, but the pain continues to return.  Patient states that the pain is worse when he is sitting and improves upon standing.  Patient denies any recent falls or injuries.  The history is provided by the patient and medical records.  Leg Pain   Past Medical History:  Diagnosis Date   DM (diabetes mellitus) (HCC)    Hyperlipidemia    Hypertension    Left sciatic nerve pain    OSA (obstructive sleep apnea)    Vitamin D deficiency    WPW (Wolff-Parkinson-White syndrome)     Patient Active Problem List   Diagnosis Date Noted   WPW (Wolff-Parkinson-White syndrome) 05/20/2021    Past Surgical History:  Procedure Laterality Date   APPENDECTOMY         Home Medications    Prior to Admission medications   Medication Sig Start Date End Date Taking? Authorizing Provider  tiZANidine (ZANAFLEX) 4 MG tablet Take 4 mg by mouth at bedtime as needed. 06/03/24  Yes [provider]  cholecalciferol (VITAMIN D3) 25 MCG (1000 UNIT) tablet Take 1,000 Units by mouth daily.    [provider]  flecainide  (TAMBOCOR ) 50 MG tablet Take 1 tablet (50 mg total) by mouth 2 (two) times daily. 05/07/24   Waddell Danelle ORN, MD  lisinopril (ZESTRIL) 20 MG tablet Take 20 mg by mouth daily.    [provider]  metFORMIN (GLUCOPHAGE-XR) 500 MG 24 hr tablet Take 500 mg by mouth 2 (two) times daily with a meal.     [provider]  metoprolol tartrate (LOPRESSOR) 50 MG tablet Take 50 mg by mouth 2 (two) times daily.    [provider]  rosuvastatin (CRESTOR) 20 MG tablet Take 20 mg by mouth daily.    [provider]    Family History Family History  Problem Relation Age of Onset   Hyperlipidemia Mother    Diabetes Father     Social History Social History   Tobacco Use   Smoking status: Never   Smokeless tobacco: Never  Vaping Use   Vaping status: Never Used  Substance Use Topics   Alcohol use: Yes    Comment: occasionally   Drug use: Never     Allergies   Patient has no known allergies.   Review of Systems Review of Systems  Per HPI  Physical Exam Triage Vital Signs ED Triage Vitals [06/16/24 1258]  Encounter Vitals Group     BP 120/72     Girls Systolic BP Percentile      Girls Diastolic BP Percentile      Boys Systolic BP Percentile      Boys Diastolic BP Percentile      Pulse Rate 64     Resp 16     Temp 99 F (37.2 C)     Temp Source  Oral     SpO2 94 %     Weight      Height      Head Circumference      Peak Flow      Pain Score      Pain Loc      Pain Education      Exclude from Growth Chart    No data found.  Updated Vital Signs BP 120/72 (BP Location: Left Arm)   Pulse 64   Temp 99 F (37.2 C) (Oral)   Resp 16   SpO2 94%   Visual Acuity Right Eye Distance:   Left Eye Distance:   Bilateral Distance:    Right Eye Near:   Left Eye Near:    Bilateral Near:     Physical Exam Vitals and nursing note reviewed.  Constitutional:      General: He is awake. He is not in acute distress.    Appearance: Normal appearance. He is well-developed and well-groomed. He is not ill-appearing.  Musculoskeletal:     Lumbar back: Tenderness present. Positive left straight leg raise test.       Back:     Comments: Tenderness noted to the left low back into buttock region  Skin:    General: Skin is warm and dry.  Neurological:      Mental Status: He is alert.  Psychiatric:        Behavior: Behavior is cooperative.      UC Treatments / Results  Labs (all labs ordered are listed, but only abnormal results are displayed) Labs Reviewed - No data to display  EKG   Radiology No results found.  Procedures Procedures (including critical care time)  Medications Ordered in UC Medications  dexamethasone  (DECADRON ) injection 10 mg (10 mg Intramuscular Given 06/16/24 1404)    Initial Impression / Assessment and Plan / UC Course  I have reviewed the triage vital signs and the nursing notes.  Pertinent labs & imaging results that were available during my care of the patient were reviewed by me and considered in my medical decision making (see chart for details).     Patient is overall well-appearing.  Vitals are stable.  Exam findings consistent with chronic sciatica.  Given IM Decadron  in clinic.  Recommended continuing with tizanidine and naproxen as needed.  Given orthopedic follow-up.  Discussed follow-up and return precautions. Final Clinical Impressions(s) / UC Diagnoses   Final diagnoses:  Sciatica of left side     Discharge Instructions      You were given an injection of Decadron  in clinic today to help reduce inflammation related to your sciatica pain. You can continue to take naproxen and tizanidine as needed for pain as well. You can follow-up with [facility] for further evaluation and management of this. Otherwise follow-up with your primary care provider or return here as needed.   ED Prescriptions   None    PDMP not reviewed this encounter.   Johnie Flaming A, NP 06/16/24 [phone]

## 2024-06-17 DIAGNOSIS — M545 Low back pain, unspecified: Secondary | ICD-10-CM | POA: Diagnosis not present

## 2024-06-23 DIAGNOSIS — M545 Low back pain, unspecified: Secondary | ICD-10-CM | POA: Diagnosis not present

## 2024-06-25 DIAGNOSIS — M545 Low back pain, unspecified: Secondary | ICD-10-CM | POA: Diagnosis not present

## 2024-06-27 DIAGNOSIS — I10 Essential (primary) hypertension: Secondary | ICD-10-CM | POA: Diagnosis not present

## 2024-06-27 DIAGNOSIS — E1169 Type 2 diabetes mellitus with other specified complication: Secondary | ICD-10-CM | POA: Diagnosis not present

## 2024-06-27 DIAGNOSIS — E785 Hyperlipidemia, unspecified: Secondary | ICD-10-CM | POA: Diagnosis not present

## 2024-06-27 DIAGNOSIS — F3342 Major depressive disorder, recurrent, in full remission: Secondary | ICD-10-CM | POA: Diagnosis not present

## 2024-07-09 DIAGNOSIS — M5116 Intervertebral disc disorders with radiculopathy, lumbar region: Secondary | ICD-10-CM | POA: Diagnosis not present

## 2024-07-09 DIAGNOSIS — Z1330 Encounter for screening examination for mental health and behavioral disorders, unspecified: Secondary | ICD-10-CM | POA: Diagnosis not present

## 2024-07-09 DIAGNOSIS — M545 Low back pain, unspecified: Secondary | ICD-10-CM | POA: Diagnosis not present

## 2024-07-28 DIAGNOSIS — I10 Essential (primary) hypertension: Secondary | ICD-10-CM | POA: Diagnosis not present

## 2024-07-28 DIAGNOSIS — F3342 Major depressive disorder, recurrent, in full remission: Secondary | ICD-10-CM | POA: Diagnosis not present

## 2024-07-28 DIAGNOSIS — E785 Hyperlipidemia, unspecified: Secondary | ICD-10-CM | POA: Diagnosis not present

## 2024-07-28 DIAGNOSIS — E1169 Type 2 diabetes mellitus with other specified complication: Secondary | ICD-10-CM | POA: Diagnosis not present

## 2024-08-27 DIAGNOSIS — E1169 Type 2 diabetes mellitus with other specified complication: Secondary | ICD-10-CM | POA: Diagnosis not present

## 2024-08-27 DIAGNOSIS — I10 Essential (primary) hypertension: Secondary | ICD-10-CM | POA: Diagnosis not present

## 2024-08-27 DIAGNOSIS — F3342 Major depressive disorder, recurrent, in full remission: Secondary | ICD-10-CM | POA: Diagnosis not present

## 2024-08-27 DIAGNOSIS — E785 Hyperlipidemia, unspecified: Secondary | ICD-10-CM | POA: Diagnosis not present

## 2024-09-27 DIAGNOSIS — E785 Hyperlipidemia, unspecified: Secondary | ICD-10-CM | POA: Diagnosis not present

## 2024-09-27 DIAGNOSIS — F3342 Major depressive disorder, recurrent, in full remission: Secondary | ICD-10-CM | POA: Diagnosis not present

## 2024-09-27 DIAGNOSIS — E1169 Type 2 diabetes mellitus with other specified complication: Secondary | ICD-10-CM | POA: Diagnosis not present

## 2024-09-27 DIAGNOSIS — F4323 Adjustment disorder with mixed anxiety and depressed mood: Secondary | ICD-10-CM | POA: Diagnosis not present

## 2024-09-27 DIAGNOSIS — I10 Essential (primary) hypertension: Secondary | ICD-10-CM | POA: Diagnosis not present

## 2024-10-27 DIAGNOSIS — F3342 Major depressive disorder, recurrent, in full remission: Secondary | ICD-10-CM | POA: Diagnosis not present

## 2024-10-27 DIAGNOSIS — E785 Hyperlipidemia, unspecified: Secondary | ICD-10-CM | POA: Diagnosis not present

## 2024-10-27 DIAGNOSIS — E1169 Type 2 diabetes mellitus with other specified complication: Secondary | ICD-10-CM | POA: Diagnosis not present

## 2024-10-27 DIAGNOSIS — I10 Essential (primary) hypertension: Secondary | ICD-10-CM | POA: Diagnosis not present

## 2024-11-04 NOTE — Progress Notes (Unsigned)
  Cardiology Office Note:  .   Date:  11/04/2024  ID:  Thuan Tippett, DOB 02-27-1951, MRN [id number] PCP: Sun, Vyvyan, MD  [facility] Providers Cardiologist: (EP): Dr. Waddell   HTN, HLD WPW  Hw saw Dr. Waddell (and EP) last 08/10/23, discussed pre-excitation with an attempted ablation when in [facility], reportedly pathway was too close to his AV node. Poorly tolerated CPAP, getting dental work done Doing well with flecainide  No changes were made  Today's visit is scheduled as an annual visit ROS:   *** flecainide , nodal blocker, EKG *** symptoms *** PMD, labs, lipids *** new EP, MD *** 6 mo visits   Arrhythmia/AAD hx WPW Flecainide  (unknown duration, started while living in [facility])  Studies Reviewed: SABRA    EKG done today and reviewed by myself:  ***   10/01/2019 stress TTE Normal LV systolic function No ST changes LVH Normal exercise tolerance for age, no EKG evidence of ischemic heart disease  Risk Assessment/Calculations:    Physical Exam:   VS:  There were no vitals taken for this visit.   Wt Readings from Last 3 Encounters:  08/10/23 187 lb 12.8 oz (85.2 kg)  08/02/22 190 lb 9.6 oz (86.5 kg)  05/20/21 184 lb 9.6 oz (83.7 kg)    GEN: Well nourished, well developed in no acute distress NECK: No JVD; No carotid bruits CARDIAC: ***RRR, no murmurs, rubs, gallops RESPIRATORY:  *** CTA b/l without rales, wheezing or rhonchi  ABDOMEN: Soft, non-tender, non-distended EXTREMITIES: *** No edema; No deformity   ASSESSMENT AND PLAN: .    WPW Longstanding flecainide  w/*** *** intervals *** burden of tachycardia *** pre-excited today  HTN HLD *** C/w his PMD     {Are you ordering a CV Procedure (e.g. stress test, cath, DCCV, TEE, etc)?   Press F2        :[id number]}     Dispo: ***  Signed, Charlies Macario Arthur, PA-C

## 2024-11-06 ENCOUNTER — Ambulatory Visit: Attending: Physician Assistant | Admitting: Physician Assistant

## 2024-11-06 VITALS — BP 104/58 | HR 59 | Ht 66.0 in | Wt 194.0 lb

## 2024-11-06 DIAGNOSIS — Z5181 Encounter for therapeutic drug level monitoring: Secondary | ICD-10-CM

## 2024-11-06 DIAGNOSIS — I456 Pre-excitation syndrome: Secondary | ICD-10-CM

## 2024-11-06 DIAGNOSIS — Z79899 Other long term (current) drug therapy: Secondary | ICD-10-CM | POA: Diagnosis not present

## 2024-11-06 NOTE — Patient Instructions (Signed)
 Medication Instructions:   Your physician recommends that you continue on your current medications as directed. Please refer to the Current Medication list given to you today.  *If you need a refill on your cardiac medications before your next appointment, please call your pharmacy*   Lab Work: NONE ORDERED  TODAY   If you have labs (blood work) drawn today and your tests are completely normal, you will receive your results only by: MyChart Message (if you have MyChart) OR A paper copy in the mail If you have any lab test that is abnormal or we need to change your treatment, we will call you to review the results.   Testing/Procedures:  NONE ORDERED  TODAY    Follow-Up: At [facility], you and your health needs are our priority.  As part of our continuing mission to provide you with exceptional heart care, our providers are all part of one team.  This team includes your primary Cardiologist (physician) and Advanced Practice Providers or APPs (Physician Assistants and Nurse Practitioners) who all work together to provide you with the care you need, when you need it.  Your next appointment:   6 month(s)  Provider:   Donnice Primus, MD or Charlies Arthur, PA-C    We recommend signing up for the patient portal called MyChart.  Sign up information is provided on this After Visit Summary.  MyChart is used to connect with patients for Virtual Visits (Telemedicine).  Patients are able to view lab/test results, encounter notes, upcoming appointments, etc.  Non-urgent messages can be sent to your provider as well.   To learn more about what you can do with MyChart, go to [email or web address].   Other Instructions

## 2024-11-18 ENCOUNTER — Telehealth: Payer: Self-pay

## 2024-11-18 DIAGNOSIS — I456 Pre-excitation syndrome: Secondary | ICD-10-CM

## 2024-11-18 DIAGNOSIS — R159 Full incontinence of feces: Secondary | ICD-10-CM | POA: Diagnosis not present

## 2024-11-18 DIAGNOSIS — Z760 Encounter for issue of repeat prescription: Secondary | ICD-10-CM

## 2024-11-18 MED ORDER — FLECAINIDE ACETATE 50 MG PO TABS: 50.0000 mg | ORAL_TABLET | Freq: Two times a day (BID) | ORAL | 1 refills | Status: AC

## 2024-11-18 NOTE — Telephone Encounter (Signed)
 Patient needs a refill of his Fleicianide (sp?) and he uses the [facility] on [address].  He can be reached at [phone].  Patient is sitting in Zone A 5th fl.  Please advise   11-18-24 VB

## 2024-11-18 NOTE — Telephone Encounter (Signed)
 Refill has been sent, you can let the pt know.

## 2024-11-25 ENCOUNTER — Other Ambulatory Visit: Payer: Self-pay | Admitting: Nurse Practitioner

## 2024-11-25 ENCOUNTER — Ambulatory Visit
Admission: RE | Admit: 2024-11-25 | Discharge: 2024-11-25 | Disposition: A | Source: Ambulatory Visit | Attending: Nurse Practitioner | Admitting: Nurse Practitioner

## 2024-11-25 DIAGNOSIS — R159 Full incontinence of feces: Secondary | ICD-10-CM
